# Patient Record
Sex: Female | Born: 1950
Health system: Southern US, Community
[De-identification: ages and names within clinical notes are randomized; demographics above are authoritative.]

## PROBLEM LIST (undated history)

## (undated) DIAGNOSIS — F32A Depression, unspecified: Secondary | ICD-10-CM

## (undated) DIAGNOSIS — T7840XA Allergy, unspecified, initial encounter: Secondary | ICD-10-CM

## (undated) DIAGNOSIS — Z8673 Personal history of transient ischemic attack (TIA), and cerebral infarction without residual deficits: Secondary | ICD-10-CM

## (undated) DIAGNOSIS — I10 Essential (primary) hypertension: Secondary | ICD-10-CM

## (undated) DIAGNOSIS — K219 Gastro-esophageal reflux disease without esophagitis: Secondary | ICD-10-CM

## (undated) DIAGNOSIS — F411 Generalized anxiety disorder: Secondary | ICD-10-CM

## (undated) DIAGNOSIS — E785 Hyperlipidemia, unspecified: Secondary | ICD-10-CM

## (undated) DIAGNOSIS — F329 Major depressive disorder, single episode, unspecified: Secondary | ICD-10-CM

## (undated) DIAGNOSIS — E119 Type 2 diabetes mellitus without complications: Secondary | ICD-10-CM

## (undated) HISTORY — DX: Personal history of transient ischemic attack (TIA), and cerebral infarction without residual deficits: Z86.73

## (undated) HISTORY — PX: CHOLECYSTECTOMY: SHX55

## (undated) HISTORY — DX: Type 2 diabetes mellitus without complications: E11.9

## (undated) HISTORY — DX: Generalized anxiety disorder: F41.1

## (undated) HISTORY — DX: Depression, unspecified: F32.A

## (undated) HISTORY — DX: Essential (primary) hypertension: I10

## (undated) HISTORY — DX: Hyperlipidemia, unspecified: E78.5

## (undated) HISTORY — DX: Allergy, unspecified, initial encounter: T78.40XA

## (undated) HISTORY — DX: Gastro-esophageal reflux disease without esophagitis: K21.9

## (undated) HISTORY — PX: TUBAL LIGATION: SHX77

---
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
## 2003-07-13 ENCOUNTER — Emergency Department (HOSPITAL_COMMUNITY): Admission: EM | Admit: 2003-07-13 | Discharge: 2003-07-13 | Payer: Self-pay | Admitting: Emergency Medicine

## 2014-07-04 ENCOUNTER — Ambulatory Visit (INDEPENDENT_AMBULATORY_CARE_PROVIDER_SITE_OTHER): Payer: Medicare Other

## 2014-07-06 ENCOUNTER — Ambulatory Visit (INDEPENDENT_AMBULATORY_CARE_PROVIDER_SITE_OTHER): Payer: Medicare Other

## 2014-07-06 ENCOUNTER — Ambulatory Visit (INDEPENDENT_AMBULATORY_CARE_PROVIDER_SITE_OTHER): Payer: Medicare Other | Admitting: Family Medicine

## 2014-07-06 ENCOUNTER — Telehealth: Payer: Self-pay

## 2014-07-06 VITALS — BP 168/110 | HR 90 | Temp 98.4°F | Resp 18 | Ht 66.0 in | Wt 230.0 lb

## 2014-07-06 DIAGNOSIS — R202 Paresthesia of skin: Secondary | ICD-10-CM | POA: Diagnosis not present

## 2014-07-06 DIAGNOSIS — I10 Essential (primary) hypertension: Secondary | ICD-10-CM

## 2014-07-06 DIAGNOSIS — R209 Unspecified disturbances of skin sensation: Secondary | ICD-10-CM

## 2014-07-06 LAB — COMPLETE METABOLIC PANEL WITH GFR
ALK PHOS: 67 U/L (ref 39–117)
ALT: 23 U/L (ref 0–35)
AST: 16 U/L (ref 0–37)
Albumin: 4.6 g/dL (ref 3.5–5.2)
BUN: 12 mg/dL (ref 6–23)
CALCIUM: 9.9 mg/dL (ref 8.4–10.5)
CHLORIDE: 103 meq/L (ref 96–112)
CO2: 24 mEq/L (ref 19–32)
CREATININE: 0.77 mg/dL (ref 0.50–1.10)
GFR, EST NON AFRICAN AMERICAN: 81 mL/min
GLUCOSE: 92 mg/dL (ref 70–99)
Potassium: 4.2 mEq/L (ref 3.5–5.3)
SODIUM: 138 meq/L (ref 135–145)
Total Bilirubin: 0.5 mg/dL (ref 0.2–1.2)
Total Protein: 8.1 g/dL (ref 6.0–8.3)

## 2014-07-06 LAB — POCT URINALYSIS DIPSTICK
Bilirubin, UA: NEGATIVE
GLUCOSE UA: NEGATIVE
KETONES UA: NEGATIVE
NITRITE UA: NEGATIVE
PROTEIN UA: NEGATIVE
SPEC GRAV UA: 1.015
Urobilinogen, UA: 0.2
pH, UA: 6

## 2014-07-06 LAB — POCT CBC
Granulocyte percent: 68.4 %G (ref 37–80)
HCT, POC: 44.4 % (ref 37.7–47.9)
Hemoglobin: 15.1 g/dL (ref 12.2–16.2)
LYMPH, POC: 2.1 (ref 0.6–3.4)
MCH: 31.2 pg (ref 27–31.2)
MCHC: 33.9 g/dL (ref 31.8–35.4)
MCV: 92 fL (ref 80–97)
MID (CBC): 0.5 (ref 0–0.9)
MPV: 7.1 fL (ref 0–99.8)
POC GRANULOCYTE: 5.7 (ref 2–6.9)
POC LYMPH %: 25.4 % (ref 10–50)
POC MID %: 6.2 % (ref 0–12)
Platelet Count, POC: 428 10*3/uL — AB (ref 142–424)
RBC: 4.83 M/uL (ref 4.04–5.48)
RDW, POC: 12.8 %
WBC: 8.3 10*3/uL (ref 4.6–10.2)

## 2014-07-06 LAB — POCT SEDIMENTATION RATE: POCT SED RATE: 29 mm/h — AB (ref 0–22)

## 2014-07-06 LAB — TSH: TSH: 1.991 u[IU]/mL (ref 0.350–4.500)

## 2014-07-06 MED ORDER — LISINOPRIL-HYDROCHLOROTHIAZIDE 10-12.5 MG PO TABS: 1.0000 | ORAL_TABLET | Freq: Every day | ORAL | Status: DC

## 2014-07-06 NOTE — Telephone Encounter (Signed)
Patient is calling to get more details about her referral. Please call! [phone]

## 2014-07-06 NOTE — Progress Notes (Signed)
07/06/2014 at 10:49 AM  Penny Bryant / DOB: [birth year] / MRN: [id number]  The patient  does not have a problem list on file.  SUBJECTIVE  Chief complaint: Follow-up and Diarrhea  Patient here for an episode of improving right sided facial and arm numbness that started last Thursday.  Patient describes "it's as if someone took a magic marker and drew straight down the center of my face and neck, and the numbness was to the right of that."  She reports she could feel pressure only, as if it was asleep.  She reports did have a sore throat the Sunday before all this happened.  She does not complain of weakness and a change in dexterity. She has high blood pressure and does not know when this started. She denies rash and itchiness.      She  has a past medical history of Allergy.    Medications reviewed and updated by myself where necessary, and exist elsewhere in the encounter.   Penny Bryant is allergic to sulfur. She  reports that she has been smoking.  She does not have any smokeless tobacco history on file. She reports that she drinks about 1.8 oz of alcohol per week. She reports that she does not use illicit drugs. She  has no sexual activity history on file. The patient  has past surgical history that includes Cholecystectomy (Bilateral).  Her family history includes Diabetes in her father and mother; Hypertension in her father, mother, and sister; Stroke in her mother.  Review of Systems  Constitutional: Negative for fever and chills.  Eyes: Negative.   Cardiovascular: Negative for chest pain.  Gastrointestinal: Negative for nausea.  Skin: Negative for itching and rash.  Neurological: Positive for tingling. Negative for dizziness, tremors, speech change, focal weakness and seizures.    OBJECTIVE  Her  height is 5\' 6"  (1.676 m) and weight is 230 lb (104.327 kg). Her oral temperature is 98.4 F (36.9 C). Her blood pressure is 168/110 and her pulse is 90. Her respiration is 18 and oxygen  saturation is 97%.  The patient's body mass index is 37.14 kg/(m^2).  Physical Exam  Constitutional: She is oriented to person, place, and time.  Cardiovascular: Normal rate.   Respiratory: Effort normal.  GI: Soft. Bowel sounds are normal.  Musculoskeletal: Normal range of motion.  Neurological: She is alert and oriented to person, place, and time. No cranial nerve deficit. Coordination normal.  Skin: Skin is warm and dry.  Psychiatric: She has a normal mood and affect. Her behavior is normal. Judgment and thought content normal.    Results for orders placed or performed in visit on 07/06/14 (from the past 24 hour(s))  POCT CBC     Status: Abnormal   Collection Time: 07/06/14 10:28 AM  Result Value Ref Range   WBC 8.3 4.6 - 10.2 K/uL   Lymph, poc 2.1 0.6 - 3.4   POC LYMPH PERCENT 25.4 10 - 50 %L   MID (cbc) 0.5 0 - 0.9   POC MID % 6.2 0 - 12 %M   POC Granulocyte 5.7 2 - 6.9   Granulocyte percent 68.4 37 - 80 %G   RBC 4.83 4.04 - 5.48 M/uL   Hemoglobin 15.1 12.2 - 16.2 g/dL   HCT, POC 66.4 40.3 - 47.9 %   MCV 92.0 80 - 97 fL   MCH, POC 31.2 27 - 31.2 pg   MCHC 33.9 31.8 - 35.4 g/dL   RDW, POC 47.4 %  Platelet Count, POC 428 (A) 142 - 424 K/uL   MPV 7.1 0 - 99.8 fL  POCT urinalysis dipstick     Status: None   Collection Time: 07/06/14 10:28 AM  Result Value Ref Range   Color, UA YELLOW    Clarity, UA CLEAR    Glucose, UA NEG\    Bilirubin, UA NEG    Ketones, UA NEG    Spec Grav, UA 1.015    Blood, UA TR    pH, UA 6.0    Protein, UA NEG    Urobilinogen, UA 0.2    Nitrite, UA NEG    Leukocytes, UA Trace    [facility] reading (PRIMARY) by  Dr. Alwyn Ren: Negative for osseous abnormality.    ASSESSMENT & PLAN  Penny Bryant was seen today for follow-up and diarrhea.  Diagnoses and all orders for this visit:  Paresthesia of arm Orders: -     DG Cervical Spine Complete; Future  Facial paresthesia Orders: -     POCT SEDIMENTATION RATE  Essential hypertension Orders: -      POCT CBC -     POCT urinalysis dipstick -     COMPLETE METABOLIC PANEL WITH GFR -     TSH    The patient was advised to call or come back to clinic if she does not see an improvement in symptoms, or worsens with the above plan.   Deliah Boston, MHS, PA-C [facility] 07/06/2014 10:49 AM

## 2014-07-07 LAB — VARICELLA ZOSTER ANTIBODY, IGG: VARICELLA IGG: 407.6 {index} — AB (ref <135.00)

## 2014-07-07 NOTE — Progress Notes (Signed)
Patient was discussed with Deliah Boston, PA-C. X-rays examined. Report made. Treatment plan agreed upon.  Peyton Najjar M.D. 07/06/14

## 2014-07-10 ENCOUNTER — Telehealth: Payer: Self-pay

## 2014-07-10 NOTE — Telephone Encounter (Signed)
Pt calling for labs. Please review. Thanks

## 2014-07-10 NOTE — Telephone Encounter (Signed)
Penny Bryant, Theses labs look okay except mild elevated ESR.  You saw her also and reviewed with me.  She is calling for reports.  I am at a conference, if you could please get back to her on these.  Thanks,  Ruben Reason MD

## 2014-07-11 NOTE — Telephone Encounter (Signed)
Spoke with patient.  We are proceeding with the MRI. Deliah Boston, MS, PA-C   4:57 PM, 07/11/2014

## 2014-07-24 ENCOUNTER — Other Ambulatory Visit: Payer: Self-pay | Admitting: Physician Assistant

## 2014-07-24 DIAGNOSIS — I10 Essential (primary) hypertension: Secondary | ICD-10-CM

## 2014-07-24 MED ORDER — LISINOPRIL-HYDROCHLOROTHIAZIDE 10-12.5 MG PO TABS: 1.0000 | ORAL_TABLET | Freq: Two times a day (BID) | ORAL | Status: DC

## 2014-07-24 NOTE — Telephone Encounter (Signed)
Blood pressure is 167/110  Please have PA / MD call patient.  Her MRI is not for a bit down the road.  [phone] Wondering if additional or change of BP meds are needed Prioritized due to BP reading.

## 2014-07-24 NOTE — Telephone Encounter (Signed)
Spoke with patient.  She is monitoring her BP daily and reports some improvement 140/90, 150/95, however she does continue to see some numbers in the 160/100s.  She also reports her facial numbness if improved.  She has an MRI schedules for 07/28/14.  Advise that she double her medication by taking one tab in the morning and one at night.  Will send a new prescription to the pharmacy.  Deliah BostonMichael Fredis Malkiewicz, MS, PA-C   8:15 PM, 07/24/2014

## 2014-07-24 NOTE — Progress Notes (Signed)
Increasing patient medication due to continued hypertension despite 2 weeks of therapy. Deliah BostonMichael Cage Gupton, MS, PA-C   8:18 PM, 07/24/2014

## 2014-07-25 NOTE — Progress Notes (Signed)
Left detailed message advising Rx was sent in.

## 2014-07-28 ENCOUNTER — Ambulatory Visit
Admission: RE | Admit: 2014-07-28 | Discharge: 2014-07-28 | Disposition: A | Discharge status: Home / Self Care | Payer: Medicare Other | Source: Ambulatory Visit | Attending: Physician Assistant | Admitting: Physician Assistant

## 2014-07-28 DIAGNOSIS — I639 Cerebral infarction, unspecified: Secondary | ICD-10-CM | POA: Diagnosis not present

## 2014-07-28 DIAGNOSIS — R202 Paresthesia of skin: Secondary | ICD-10-CM

## 2014-07-28 DIAGNOSIS — R209 Unspecified disturbances of skin sensation: Secondary | ICD-10-CM

## 2014-07-28 DIAGNOSIS — R200 Anesthesia of skin: Secondary | ICD-10-CM | POA: Diagnosis not present

## 2014-07-28 MED ORDER — GADOBENATE DIMEGLUMINE 529 MG/ML IV SOLN
20.0000 mL | Freq: Once | INTRAVENOUS | Status: AC | PRN
  Administered 2014-07-28: 20 mL via INTRAVENOUS

## 2014-07-30 ENCOUNTER — Ambulatory Visit (INDEPENDENT_AMBULATORY_CARE_PROVIDER_SITE_OTHER): Payer: Medicare Other | Admitting: Physician Assistant

## 2014-07-30 ENCOUNTER — Other Ambulatory Visit: Payer: Self-pay | Admitting: Physician Assistant

## 2014-07-30 VITALS — BP 130/70 | HR 100 | Temp 98.5°F | Resp 18 | Ht 66.0 in | Wt 228.2 lb

## 2014-07-30 DIAGNOSIS — I10 Essential (primary) hypertension: Secondary | ICD-10-CM

## 2014-07-30 DIAGNOSIS — Z720 Tobacco use: Secondary | ICD-10-CM

## 2014-07-30 DIAGNOSIS — R000 Tachycardia, unspecified: Secondary | ICD-10-CM | POA: Diagnosis not present

## 2014-07-30 DIAGNOSIS — Z131 Encounter for screening for diabetes mellitus: Secondary | ICD-10-CM | POA: Diagnosis not present

## 2014-07-30 DIAGNOSIS — E785 Hyperlipidemia, unspecified: Secondary | ICD-10-CM

## 2014-07-30 DIAGNOSIS — F17210 Nicotine dependence, cigarettes, uncomplicated: Secondary | ICD-10-CM

## 2014-07-30 DIAGNOSIS — F4323 Adjustment disorder with mixed anxiety and depressed mood: Secondary | ICD-10-CM | POA: Diagnosis not present

## 2014-07-30 DIAGNOSIS — I639 Cerebral infarction, unspecified: Secondary | ICD-10-CM

## 2014-07-30 LAB — POCT CBC
Granulocyte percent: 69.7 % (ref 37–80)
HCT, POC: 42.7 % (ref 37.7–47.9)
Hemoglobin: 14.2 g/dL (ref 12.2–16.2)
Lymph, poc: 2.2 (ref 0.6–3.4)
MCH, POC: 30.3 pg (ref 27–31.2)
MCHC: 33.2 g/dL (ref 31.8–35.4)
MCV: 91 fL (ref 80–97)
MID (cbc): 0.6 (ref 0–0.9)
MPV: 7.1 fL (ref 0–99.8)
POC Granulocyte: 6.3 (ref 2–6.9)
POC LYMPH PERCENT: 23.9 % (ref 10–50)
POC MID %: 6.4 % (ref 0–12)
Platelet Count, POC: 417 K/uL (ref 142–424)
RBC: 4.69 M/uL (ref 4.04–5.48)
RDW, POC: 11.7 %
WBC: 9 K/uL (ref 4.6–10.2)

## 2014-07-30 LAB — LIPID PANEL
Cholesterol: 240 mg/dL — ABNORMAL HIGH (ref 0–200)
HDL: 38 mg/dL — AB (ref >=46)
Total CHOL/HDL Ratio: 6.3 Ratio
Triglycerides: 403 mg/dL — ABNORMAL HIGH (ref <150)

## 2014-07-30 LAB — COMPLETE METABOLIC PANEL WITHOUT GFR
ALT: 26 U/L (ref 0–35)
AST: 20 U/L (ref 0–37)
Albumin: 4.5 g/dL (ref 3.5–5.2)
Alkaline Phosphatase: 62 U/L (ref 39–117)
BUN: 14 mg/dL (ref 6–23)
CO2: 27 meq/L (ref 19–32)
Calcium: 9.4 mg/dL (ref 8.4–10.5)
Chloride: 100 meq/L (ref 96–112)
Creat: 0.82 mg/dL (ref 0.50–1.10)
GFR, Est African American: 87 mL/min
GFR, Est Non African American: 75 mL/min
Glucose, Bld: 111 mg/dL — ABNORMAL HIGH (ref 70–99)
Potassium: 4 meq/L (ref 3.5–5.3)
Sodium: 139 meq/L (ref 135–145)
Total Bilirubin: 0.4 mg/dL (ref 0.2–1.2)
Total Protein: 7.7 g/dL (ref 6.0–8.3)

## 2014-07-30 LAB — POCT GLYCOSYLATED HEMOGLOBIN (HGB A1C): Hemoglobin A1C: 6

## 2014-07-30 MED ORDER — ROSUVASTATIN CALCIUM 10 MG PO TABS: 10.0000 mg | ORAL_TABLET | Freq: Every day | ORAL | Status: DC

## 2014-07-30 MED ORDER — METOPROLOL TARTRATE 25 MG PO TABS: 12.5000 mg | ORAL_TABLET | Freq: Two times a day (BID) | ORAL | Status: DC

## 2014-07-30 NOTE — Progress Notes (Unsigned)
Patient with ASCVD risk of 17%.  She continues to smoke.  Will reevaluate the need for this medication in the future. Deliah BostonMichael Lariza Cothron, MS, PA-C   4:46 PM, 07/30/2014

## 2014-07-30 NOTE — Progress Notes (Signed)
07/30/2014 at 10:49 AM  Penny Bryant / DOB: 08/26/1950 / MRN: [id number]  The patient has Essential hypertension on her problem list.  SUBJECTIVE  Chief complaint: Blood Pressure Check and Results   Patient here for BP check and MRI results.  She has a BP log with her today and it has been running around 130-150/90-100.  She denies any side effects associated with the medication. Her numbness continues, but she denies a change in neuromuscular function.   She reports being under quite a lot of stress lately due to her recent diagnosis and her crazy family.  She feels frightened with regard to herself and frustrated with her family.  She is amenable to see a psychotherapist.   She  has a past medical history of Allergy.    Medications reviewed and updated by myself where necessary, and exist elsewhere in the encounter.   Penny Bryant is allergic to sulfur. She  reports that she has been smoking.  She does not have any smokeless tobacco history on file. She reports that she drinks about 1.8 oz of alcohol per week. She reports that she does not use illicit drugs. She  has no sexual activity history on file. The patient  has past surgical history that includes Cholecystectomy (Bilateral).  Her family history includes Diabetes in her father and mother; Hypertension in her father, mother, and sister; Stroke in her mother.  Review of Systems  Constitutional: Negative for fever and chills.  Eyes: Negative for blurred vision.  Respiratory: Negative for cough.   Cardiovascular: Negative for chest pain.  Gastrointestinal: Negative for nausea.  Genitourinary: Negative.   Musculoskeletal: Negative for myalgias.  Skin: Negative for itching and rash.  Neurological: Negative for dizziness and headaches.  Endo/Heme/Allergies: Negative for polydipsia.  Psychiatric/Behavioral: Positive for depression. Negative for suicidal ideas, hallucinations, memory loss and substance abuse. The patient is nervous/anxious.  The patient does not have insomnia.     Depression screen [facility] 07/30/2014 07/04/2014  Decreased Interest 1 0  Down, Depressed, Hopeless 1 0  PHQ - 2 Score 2 0  Altered sleeping 3 -  Tired, decreased energy 2 -  Change in appetite 2 -  Feeling bad or failure about yourself  0 -  Trouble concentrating 1 -  Moving slowly or fidgety/restless 0 -  Suicidal thoughts 0 -  PHQ-9 Score 10 -  Difficult doing work/chores Somewhat difficult -      OBJECTIVE  Her  height is 5\' 6"  (1.676 m) and weight is 228 lb 4 oz (103.534 kg). Her oral temperature is 98.5 F (36.9 C). Her blood pressure is 130/70 and her pulse is 100. Her respiration is 18 and oxygen saturation is 97%.  The patient's body mass index is 36.86 kg/(m^2).  Physical Exam  Nursing note and vitals reviewed. Constitutional: She is oriented to person, place, and time. She appears well-developed and well-nourished. She does not appear ill.  HENT:  Mouth/Throat: Oropharynx is clear and moist.  Eyes: EOM are normal. Pupils are equal, round, and reactive to light.  Neck: No thyromegaly present.  Cardiovascular: Normal rate and regular rhythm.   Respiratory: Effort normal.  GI: She exhibits no distension.  Musculoskeletal: Normal range of motion.  Neurological: She is alert and oriented to person, place, and time. She has normal strength and normal reflexes. She displays no atrophy and no tremor. No cranial nerve deficit or sensory deficit. She exhibits normal muscle tone. She displays no seizure activity. Coordination and gait normal.  GCS eye subscore is 4. GCS verbal subscore is 5. GCS motor subscore is 6.  Skin: Skin is warm and dry.  Psychiatric: She has a normal mood and affect. Her behavior is normal. Judgment and thought content normal.    Results for orders placed or performed in visit on 07/30/14 (from the past 24 hour(s))  POCT CBC     Status: None   Collection Time: 07/30/14  9:25 AM  Result Value Ref Range   WBC 9.0  4.6 - 10.2 K/uL   Lymph, poc 2.2 0.6 - 3.4   POC LYMPH PERCENT 23.9 10 - 50 %L   MID (cbc) 0.6 0 - 0.9   POC MID % 6.4 0 - 12 %M   POC Granulocyte 6.3 2 - 6.9   Granulocyte percent 69.7 37 - 80 %G   RBC 4.69 4.04 - 5.48 M/uL   Hemoglobin 14.2 12.2 - 16.2 g/dL   HCT, POC 16.1 09.6 - 47.9 %   MCV 91.0 80 - 97 fL   MCH, POC 30.3 27 - 31.2 pg   MCHC 33.2 31.8 - 35.4 g/dL   RDW, POC 04.5 %   Platelet Count, POC 417 142 - 424 K/uL   MPV 7.1 0 - 99.8 fL  POCT glycosylated hemoglobin (Hb A1C)     Status: None   Collection Time: 07/30/14  9:25 AM  Result Value Ref Range   Hemoglobin A1C 6.0     ASSESSMENT & PLAN  Rella was seen today for blood pressure check and results.  Diagnoses and all orders for this visit:  Tachycardia with 100 - 120 beats per minute: Patients heart rate appears labile.  Will start at a low dose of metoprolol and titrate accordingly.  Orders: -     metoprolol tartrate (LOPRESSOR) 25 MG tablet; Take 0.5 tablets (12.5 mg total) by mouth 2 (two) times daily. -     EKG 12-Lead  Adjustment disorder with mixed anxiety and depressed mood: Patient with positive PHQ-9 screening into mild depression. This is most likely 2/2 a sudden change in her health.  Options discussed and patient opting for counseling at this time. Will revisit this in at future visits.  Orders: -     Ambulatory referral to Psychology  CVA (cerebral vascular accident): Patient with positive MRI for ischemia in the right basal ganglia and thalmus. Given the hemosiderin deposition seen on MRI I've asked that she hold off of ASA for now and await a recommendation from her neurologist.  Fortunately it appears that this incident was mild and her neurological exam remains intact, however her numbness persist. I will attempt to control for risk factors and have pleaded with her to quit smoking.  Her BP is reducing nicely and I will strive to achieve a BP lower than 130/90. I have spoken with our ancillary staff  in an attempt to have her seen by neurology next week.     Orders: -     Ambulatory referral to Neurology  Essential hypertension: She is responding to Prinzide 10-12.5 nicely.  I will add Metoprolol in the hopes of achieve an additional mild reduction and to manage problem one.  Orders: -     POCT CBC -     COMPLETE METABOLIC PANEL WITH GFR  Screening for diabetes mellitus: A1C elevated to that of a prediabetic.  Will discuss this with patient at her next follow up.   Orders: -     POCT glycosylated hemoglobin (Hb A1C)  Cigarette  smoker: Managed with problem three.  Patient has agreed to quit soon.  I will prescribe per her request.      The patient was advised to call or come back to clinic if she does not see an improvement in symptoms, or worsens with the above plan.   Deliah Boston, MHS, PA-C [facility] 07/30/2014 10:49 AM

## 2014-08-02 ENCOUNTER — Telehealth: Payer: Self-pay

## 2014-08-02 NOTE — Telephone Encounter (Signed)
Patient is calling because she has [facility] and they needs the MRI results sent.

## 2014-08-02 NOTE — Telephone Encounter (Signed)
Spoke with patient. Informed her that [facility] should be on Epic and already have access to MRI report. Called [facility] and spoke with Annabelle HarmanDana who confirmed that they are on Epic and no need to send report.

## 2014-08-04 ENCOUNTER — Telehealth: Payer: Self-pay

## 2014-08-04 ENCOUNTER — Telehealth: Payer: Self-pay | Admitting: Physician Assistant

## 2014-08-04 NOTE — Telephone Encounter (Signed)
Penny Bryant, pt called. She says that the Crestor is giving her myalgia in her neck and back. Wonders if the 10mg  is too strong or if she should try something else. I don't her to D/C it in the meantime. Please advise. Thanks

## 2014-08-04 NOTE — Telephone Encounter (Signed)
Please call her back and ask her to to break the tab in half and take every other day for now. Penny BostonMichael Clark, MS, PA-C   8:42 AM, 08/04/2014

## 2014-08-04 NOTE — Telephone Encounter (Signed)
Pt called about Crestor and she has decreased her dose to 5mg /day. She states she is still having and neck pain.  Please advise [phone]

## 2014-08-04 NOTE — Telephone Encounter (Signed)
Pt.notified

## 2014-08-05 NOTE — Telephone Encounter (Signed)
Please call patient and ask her to take 1/4 of the pill daily.  If this fails then we may have to try another medication.  Penny BostonMichael Tiffanny Lamarche, MS, PA-C   9:55 AM, 08/05/2014

## 2014-08-08 ENCOUNTER — Ambulatory Visit (INDEPENDENT_AMBULATORY_CARE_PROVIDER_SITE_OTHER): Payer: Medicare Other | Admitting: Neurology

## 2014-08-08 ENCOUNTER — Encounter: Payer: Self-pay | Admitting: Neurology

## 2014-08-08 VITALS — BP 132/78 | HR 76 | Resp 16 | Ht 67.0 in | Wt 227.0 lb

## 2014-08-08 DIAGNOSIS — E785 Hyperlipidemia, unspecified: Secondary | ICD-10-CM | POA: Diagnosis not present

## 2014-08-08 DIAGNOSIS — I6381 Other cerebral infarction due to occlusion or stenosis of small artery: Secondary | ICD-10-CM

## 2014-08-08 DIAGNOSIS — I6322 Cerebral infarction due to unspecified occlusion or stenosis of basilar arteries: Principal | ICD-10-CM

## 2014-08-08 DIAGNOSIS — I63212 Cerebral infarction due to unspecified occlusion or stenosis of left vertebral arteries: Secondary | ICD-10-CM

## 2014-08-08 DIAGNOSIS — I639 Cerebral infarction, unspecified: Secondary | ICD-10-CM | POA: Insufficient documentation

## 2014-08-08 DIAGNOSIS — I10 Essential (primary) hypertension: Secondary | ICD-10-CM

## 2014-08-08 DIAGNOSIS — F17200 Nicotine dependence, unspecified, uncomplicated: Secondary | ICD-10-CM

## 2014-08-08 DIAGNOSIS — Z720 Tobacco use: Secondary | ICD-10-CM

## 2014-08-08 DIAGNOSIS — I63219 Cerebral infarction due to unspecified occlusion or stenosis of unspecified vertebral arteries: Secondary | ICD-10-CM | POA: Insufficient documentation

## 2014-08-08 DIAGNOSIS — E669 Obesity, unspecified: Secondary | ICD-10-CM

## 2014-08-08 NOTE — Telephone Encounter (Signed)
Spoke with pt, advised message from Michael. Pt understood. 

## 2014-08-08 NOTE — Telephone Encounter (Signed)
Patient states that she went to her neurologist today and they advised her that she needs to stop taking the Crestor and start taking Prozac.   [phone]

## 2014-08-08 NOTE — Progress Notes (Signed)
NEUROLOGY CONSULTATION NOTE  SHELLEY COCKE MRN: [id number] DOB: 1950-06-10  Referring provider: Deliah Boston, PA-C Primary care provider: Deliah Boston, PA-C  Reason for consult:  stroke  Thank you for your kind referral of Nesha L Roedl for consultation of the above symptoms. Although her history is well known to you, please allow me to reiterate it for the purpose of our medical record. Records and images were personally reviewed where available.  HISTORY OF PRESENT ILLNESS: This is a pleasant 64 year old right-handed woman with vascular risk factors including hypertension, hyperlipidemia, tobacco use, presenting for evaluation of right-sided numbness that started last 07/06/2014. She reports being under a great deal of stress from family, she had been significantly stressed for 2 days when she woke up on 07/06/14 with the right side of her face and arm (up to wrist) feeling numb and tingly. The next day, she went to a graduation and noticed difficulties sitting on the chair, finding that the right buttock region was numb as well. Her right leg was unaffected. She denied any focal weakness. She felt a little confused that weekend. She saw her PCP and MRI was ordered, which was done last 07/28/14. I personally reviewed MRI brain with and without contrast which did not show any acute infarct. There is an old infarct in the right basal ganglia and right thalamus with mild hemosiderin deposition. 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 She reports that symptoms started improving last weekend, the numbness in her right buttock region has resolved, there is only a 3-width band of numbness from the right vertex region down her right ear, ending in the mid-upper arm. She had bloodwork done showing an HbA1c of 6, lipid panel showed elevated total cholesterol 240, triglycerides 403. LDL could not be calculated due to triglyceride >400. She was started on Crestor, which she has  been having difficulties with. She feels diffuse body weakness and fatigue, as well as cramps in her calves and back. She has neck stiffness and feels like her head is a "bowling ball sitting on top of a toothpick." She is concerned because she is usually active but states now she cannot get up and go anymore. She gets woozy when bending over. She denies any dysarthria, dysphagia, vision changes, back pain, bowel/bladder incontinence. She has infrequent headaches, but had one 4 days ago and took Ibuprofen. She used to drink around 3 glasses of wine/week, she has only had one drink in the past month. She had previously been smoking 3 packs a week, she has reduced this recently to 2 packs/week. Her mother has a history of stroke, she denies any prior similar symptoms.  Laboratory Data: Lab Results  Component Value Date   WBC 9.0 07/30/2014   HGB 14.2 07/30/2014   HCT 42.7 07/30/2014   MCV 91.0 07/30/2014     Chemistry      Component Value Date/Time   NA 139 07/30/2014 0913   K 4.0 07/30/2014 0913   CL 100 07/30/2014 0913   CO2 27 07/30/2014 0913   BUN 14 07/30/2014 0913   CREATININE 0.82 07/30/2014 0913      Component Value Date/Time   CALCIUM 9.4 07/30/2014 0913   ALKPHOS 62 07/30/2014 0913   AST 20 07/30/2014 0913   ALT 26 07/30/2014 0913   BILITOT 0.4 07/30/2014 0913     Lab Results  Component Value Date   CHOL 240* 07/30/2014   HDL 38* 07/30/2014   LDLCALC  NOT CALC 07/30/2014   TRIG 403* 07/30/2014   CHOLHDL 6.3 07/30/2014   Lab Results  Component Value Date   TSH 1.991 07/06/2014      PAST MEDICAL HISTORY: Past Medical History  Diagnosis Date  . Allergy     PAST SURGICAL HISTORY: Past Surgical History  Procedure Laterality Date  . Cholecystectomy Bilateral     MEDICATIONS: Current Outpatient Prescriptions on File Prior to Visit  Medication Sig Dispense Refill  . metoprolol tartrate (LOPRESSOR) 25 MG tablet Take 0.5 tablets (12.5 mg total) by mouth 2 (two)  times daily. 30 tablet 3  . rosuvastatin (CRESTOR) 10 MG tablet Take 1 tablet (10 mg total) by mouth daily. (Patient taking differently: Take by mouth daily. Takes 1/4 tablet daily) 30 tablet 3   No current facility-administered medications on file prior to visit.    ALLERGIES: Allergies  Allergen Reactions  . Sulfur Swelling    FAMILY HISTORY: Family History  Problem Relation Age of Onset  . Diabetes Mother   . Hypertension Mother   . Stroke Mother   . Diabetes Father   . Hypertension Father   . Hypertension Sister     SOCIAL HISTORY: History   Social History  . Marital Status: Divorced    Spouse Name: N/A  . Number of Children: 3  . Years of Education: N/A   Occupational History  . Pharmacy Technician    Social History Main Topics  . Smoking status: Current Every Day Smoker -- 1.50 packs/day  . Smokeless tobacco: Never Used  . Alcohol Use: 1.8 oz/week    3 Glasses of wine per week  . Drug Use: No  . Sexual Activity: Not on file   Other Topics Concern  . Not on file   Social History Narrative    REVIEW OF SYSTEMS: Constitutional: No fevers, chills, or sweats, no generalized fatigue, change in appetite Eyes: No visual changes, double vision, eye pain Ear, nose and throat: No hearing loss, ear pain, nasal congestion, sore throat Cardiovascular: No chest pain, palpitations Respiratory:  No shortness of breath at rest or with exertion, wheezes GastrointestinaI: No nausea, vomiting, diarrhea, abdominal pain, fecal incontinence Genitourinary:  No dysuria, urinary retention or frequency Musculoskeletal:  + neck pain, no back pain Integumentary: No rash, pruritus, skin lesions Neurological: as above Psychiatric: No depression, insomnia, anxiety Endocrine: No palpitations, fatigue, diaphoresis, mood swings, change in appetite, change in weight, increased thirst Hematologic/Lymphatic:  No anemia, purpura, petechiae. Allergic/Immunologic: no itchy/runny eyes,  nasal congestion, recent allergic reactions, rashes  PHYSICAL EXAM: Filed Vitals:   08/08/14 1245  BP: 132/78  Pulse: 76  Resp: 16   Filed Vitals:   08/08/14 1245  Height: 5\' 7"  (1.702 m)  Weight: 227 lb (102.967 kg)    General: No acute distress Head:  Normocephalic/atraumatic Eyes: Fundoscopic exam shows bilateral sharp discs, no vessel changes, exudates, or hemorrhages Neck: supple, no paraspinal tenderness, full range of motion Back: No paraspinal tenderness Heart: regular rate and rhythm Lungs: Clear to auscultation bilaterally. Vascular: No carotid bruits. Skin/Extremities: No rash, no edema Neurological Exam: Mental status: alert and oriented to person, place, and time, no dysarthria or aphasia, Fund of knowledge is appropriate.  Recent and remote memory are intact.  Attention and concentration are normal.    Able to name objects and repeat phrases. Cranial nerves: CN I: not tested CN II: pupils equal, round and reactive to light, visual fields intact, fundi unremarkable. CN III, IV, VI:  full range of motion, no  nystagmus, no ptosis CN V: facial sensation intact CN VII: upper and lower face symmetric CN VIII: hearing intact to finger rub CN IX, X: gag intact, uvula midline CN XI: sternocleidomastoid and trapezius muscles intact CN XII: tongue midline Bulk & Tone: normal, no fasciculations. Motor: 5/5 throughout with no pronator drift. Sensation: intact to light touch, cold, pin, vibration and joint position sense.  No extinction to double simultaneous stimulation.  Romberg test negative Deep Tendon Reflexes: +1 throughout, no ankle clonus Plantar responses: downgoing bilaterally Cerebellar: no incoordination on finger to nose, heel to shin. No dysdiadochokinesia Gait: narrow-based and steady, able to tandem walk adequately. Tremor: none  IMPRESSION: This is a pleasant 64 year old right-handed woman with vascular risk factors including obesity, hypertension,  hyperlipidemia, tobacco use, presenting with a 41-month history of right face and arm numbness that started when she woke up with symptoms last 07/06/14. Her MRI brain done a month after does not show any acute changes, there is a chronic infarct in the right basal ganglia and thalamus, which would not explain her right-sided symptoms. She likely had an MRI-negative left thalamic infarct causing sensory symptoms. This is typically caused by small vessel disease, however stroke workup with carotid dopplers and 2-D echocardiogram with bubble study will be ordered to complete the workup. BP today 132/78. Her lipid panel is abnormal, we discussed the importance of control of vascular risk factors, she will continue to work with her PCP on hyperlipidemia. We discussed the importance of smoking cessation. She will start a daily baby aspirin for secondary stroke prevention. She knows to go to the ER immediately if symptoms recur. We discussed neck stiffness and option for physical therapy, which she declines at this time. She will follow-up in 3 months.   Thank you for allowing me to participate in the care of this patient. Please do not hesitate to call for any questions or concerns.   Patrcia Dolly, M.D.  CC: Deliah Boston, PA-C

## 2014-08-08 NOTE — Patient Instructions (Addendum)
1. Schedule carotid dopplers and 2D echocardiogram with bubble study 2. Start daily aspirin  3. Continue working on cholesterol with your PCP 4. Smoking cessation is important 5. If symptoms recur, go to ER immediately 6. Follow-up in 3 months

## 2014-08-08 NOTE — Telephone Encounter (Signed)
Pt would like to see if Casimiro NeedleMichael can Rx Prozac because she is still stressing and it helps her sister.

## 2014-08-09 ENCOUNTER — Other Ambulatory Visit: Payer: Self-pay | Admitting: Physician Assistant

## 2014-08-09 DIAGNOSIS — F4323 Adjustment disorder with mixed anxiety and depressed mood: Secondary | ICD-10-CM

## 2014-08-09 MED ORDER — ATORVASTATIN CALCIUM 10 MG PO TABS: 5.0000 mg | ORAL_TABLET | Freq: Every day | ORAL | Status: DC

## 2014-08-09 MED ORDER — FLUOXETINE HCL 20 MG PO CAPS: 20.0000 mg | ORAL_CAPSULE | Freq: Every day | ORAL | Status: DC

## 2014-08-09 NOTE — Telephone Encounter (Signed)
Please call patient and advise that prozac 20 mg was sent to the pharmacy. I have read the neurology notes and agree with the plan. I have a follow up with her soon and will go ahead and send a different lipid medication and will start at the lowest dose,  to the pharmacy that she may pick up with her prozac. Deliah BostonMichael Clark, MS, PA-C   3:22 PM, 08/09/2014

## 2014-08-10 ENCOUNTER — Ambulatory Visit: Payer: Medicare Other

## 2014-08-10 NOTE — Telephone Encounter (Signed)
Left detailed message letting pt know. 

## 2014-08-24 ENCOUNTER — Telehealth: Payer: Self-pay | Admitting: Neurology

## 2014-08-24 NOTE — Telephone Encounter (Signed)
Pt called about a referral for an Ultra sound/doppler/ call back @

## 2014-08-24 NOTE — Telephone Encounter (Signed)
I called [facility] [address] after getting patient's phone message. I called the office to check on the status of patient being called as the order was sent to them on 7/13 for a Bubble Study. I asked that they call the patient to schedule appt as she has been waiting to get this test done because I didn't know her availability.   I called patient and apologized to her for not having received a call yet. I did tell her that someone should be calling her to set up her test. I also gave her their phone number to call them in the morning if she didn't hear anything today. Patient stated she would do that.

## 2014-08-24 NOTE — Telephone Encounter (Signed)
Pt called and said she has not heard anything about an appointment for an ultrasound/Dawn CB# [phone]

## 2014-08-28 ENCOUNTER — Ambulatory Visit (INDEPENDENT_AMBULATORY_CARE_PROVIDER_SITE_OTHER): Payer: Medicare Other | Admitting: Physician Assistant

## 2014-08-28 ENCOUNTER — Telehealth: Payer: Self-pay | Admitting: *Deleted

## 2014-08-28 ENCOUNTER — Encounter: Payer: Self-pay | Admitting: Physician Assistant

## 2014-08-28 VITALS — BP 134/78 | HR 83 | Temp 99.0°F | Resp 16 | Wt 224.8 lb

## 2014-08-28 DIAGNOSIS — F418 Other specified anxiety disorders: Secondary | ICD-10-CM

## 2014-08-28 DIAGNOSIS — Z23 Encounter for immunization: Secondary | ICD-10-CM

## 2014-08-28 DIAGNOSIS — E785 Hyperlipidemia, unspecified: Secondary | ICD-10-CM | POA: Diagnosis not present

## 2014-08-28 DIAGNOSIS — Z889 Allergy status to unspecified drugs, medicaments and biological substances status: Secondary | ICD-10-CM | POA: Diagnosis not present

## 2014-08-28 DIAGNOSIS — F17200 Nicotine dependence, unspecified, uncomplicated: Secondary | ICD-10-CM

## 2014-08-28 DIAGNOSIS — Z720 Tobacco use: Secondary | ICD-10-CM | POA: Diagnosis not present

## 2014-08-28 DIAGNOSIS — I10 Essential (primary) hypertension: Secondary | ICD-10-CM

## 2014-08-28 DIAGNOSIS — I6322 Cerebral infarction due to unspecified occlusion or stenosis of basilar arteries: Principal | ICD-10-CM

## 2014-08-28 DIAGNOSIS — F419 Anxiety disorder, unspecified: Secondary | ICD-10-CM

## 2014-08-28 DIAGNOSIS — I63212 Cerebral infarction due to unspecified occlusion or stenosis of left vertebral arteries: Secondary | ICD-10-CM

## 2014-08-28 DIAGNOSIS — I6381 Other cerebral infarction due to occlusion or stenosis of small artery: Secondary | ICD-10-CM

## 2014-08-28 DIAGNOSIS — F32A Depression, unspecified: Secondary | ICD-10-CM

## 2014-08-28 DIAGNOSIS — F329 Major depressive disorder, single episode, unspecified: Secondary | ICD-10-CM | POA: Insufficient documentation

## 2014-08-28 DIAGNOSIS — Z789 Other specified health status: Secondary | ICD-10-CM

## 2014-08-28 LAB — COMPLETE METABOLIC PANEL WITH GFR
ALT: 22 U/L (ref 6–29)
AST: 17 U/L (ref 10–35)
Albumin: 4.3 g/dL (ref 3.6–5.1)
Alkaline Phosphatase: 64 U/L (ref 33–130)
BILIRUBIN TOTAL: 0.5 mg/dL (ref 0.2–1.2)
BUN: 18 mg/dL (ref 7–25)
CO2: 27 mmol/L (ref 20–31)
Calcium: 9.9 mg/dL (ref 8.6–10.4)
Chloride: 97 mmol/L — ABNORMAL LOW (ref 98–110)
Creat: 0.99 mg/dL (ref 0.50–0.99)
GFR, EST AFRICAN AMERICAN: 69 mL/min (ref >=60)
GFR, Est Non African American: 60 mL/min (ref >=60)
Glucose, Bld: 106 mg/dL — ABNORMAL HIGH (ref 65–99)
POTASSIUM: 4.1 mmol/L (ref 3.5–5.3)
Sodium: 137 mmol/L (ref 135–146)
TOTAL PROTEIN: 7.9 g/dL (ref 6.1–8.1)

## 2014-08-28 MED ORDER — ATORVASTATIN CALCIUM 10 MG PO TABS: ORAL_TABLET | ORAL | Status: DC

## 2014-08-28 MED ORDER — ZOSTER VACCINE LIVE 19400 UNT/0.65ML ~~LOC~~ SOLR: 0.6500 mL | Freq: Once | SUBCUTANEOUS | Status: DC

## 2014-08-28 NOTE — Progress Notes (Signed)
08/28/2014 at 2:12 PM  Penny Bryant / DOB: 1950-06-26 / MRN: [id number]  The patient has Essential hypertension; Stroke; Arterial ischemic stroke, vertebrobasilar, thalamic, acute; Hyperlipidemia; Tobacco use disorder; and Obesity on her problem list.  SUBJECTIVE  Chief complaint: Hypertension; Neurology appointment; and side effects from medication  Patient here for follow up of medication and neuro visit.  She would also like her pneumonia vaccination and to receive a "shingles shot."    Reports the neurologist informed her that she has had two strokes, one roughly a year ago and "a smaller one in the last two months."  She has been placed on ASA daily and was advised to seek medication for depression, at which time she contacted the office and Prozac was prescribed.  She is tolerating this without difficulty and reports "I'm not focusing on negative things as much and my mood is improved." She denies depression and anhedonia today. She did follow up with psychology but informed me that the practice does not accept her insurance.  She has chosen to seek counseling from her pastor at this time, and reports this is helping her sort through some of her emotions.  She will call if she desires another referral to a therapist.    She reports some side effects with rosuvastatin, stating that her neck feels "as stiff as a board" and reports this side effect is causing her difficulty with exercise. She has also tried Lipitor 5 mg qd and reports some improvement in this pain, but it is still present.   She is down to three cigarettes a day, one in the morning, noon and night.  She plans to cut these out completely in the coming months.     She has never had a colonoscopy, and is will to do this, however she would like to wait a few months until some of her other health issues "smooth out."   She  has a past medical history of Allergy.    Current Outpatient Prescriptions on File Prior to Visit    Medication Sig Dispense Refill  . cetirizine (ZYRTEC) 10 MG tablet Take 10 mg by mouth daily.    Marland Kitchen FLUoxetine (PROZAC) 20 MG capsule Take 1 capsule (20 mg total) by mouth daily. 30 capsule 3  . lisinopril-hydrochlorothiazide (PRINZIDE,ZESTORETIC) 20-25 MG per tablet Take 0.5 tablets by mouth 2 (two) times daily. Take 1/2 tablet twice daily  1  . metoprolol tartrate (LOPRESSOR) 25 MG tablet Take 0.5 tablets (12.5 mg total) by mouth 2 (two) times daily. 30 tablet 3   No current facility-administered medications on file prior to visit.     Ms. Bryant is allergic to sulfur. She  reports that she has been smoking.  She has never used smokeless tobacco. She reports that she drinks about 1.8 oz of alcohol per week. She reports that she does not use illicit drugs. She  has no sexual activity history on file. The patient  has past surgical history that includes Cholecystectomy (Bilateral).  Her family history includes Diabetes in her father and mother; Hypertension in her father, mother, and sister; Stroke in her mother.  Review of Systems  Constitutional: Positive for malaise/fatigue. Negative for chills and weight loss.  Respiratory: Negative for cough.   Cardiovascular: Negative for chest pain.  Gastrointestinal: Negative for nausea.  Skin: Negative for itching and rash.  Neurological: Negative for dizziness, focal weakness and headaches.    OBJECTIVE  Her  weight is 224 lb 12.8 oz (101.969 kg).  Her oral temperature is 99 F (37.2 C). Her blood pressure is 134/78 and her pulse is 83. Her respiration is 16.  The patient's body mass index is 35.2 kg/(m^2).  Physical Exam  Nursing note and vitals reviewed. Constitutional: She is oriented to person, place, and time. She appears well-developed and well-nourished. She does not appear ill.  HENT:  Mouth/Throat: Oropharynx is clear and moist.  Eyes: EOM are normal. Pupils are equal, round, and reactive to light.  Neck: No thyromegaly present.   Cardiovascular: Normal rate and regular rhythm.   Respiratory: Effort normal.  GI: She exhibits no distension.  Musculoskeletal: Normal range of motion.  Neurological: She is alert and oriented to person, place, and time. She has normal strength and normal reflexes. She displays no atrophy and no tremor. No cranial nerve deficit or sensory deficit. She exhibits normal muscle tone. She displays no seizure activity. Coordination and gait normal. GCS eye subscore is 4. GCS verbal subscore is 5. GCS motor subscore is 6.  Skin: Skin is warm and dry.  Psychiatric: She has a normal mood and affect. Her behavior is normal. Judgment and thought content normal.    No results found for this or any previous visit (from the past 24 hour(s)).  ASSESSMENT & PLAN  Penny was seen today for hypertension, neurology appointment and side effects from medication.  Diagnoses and all orders for this visit:  Essential hypertension: Patient with log here and will continue to track.  Her BP is on average, less 135/80.  Will maintain current treatment regimen and encouraged patient to continue with weight loss, as she has lost 8 lbs since her first visit to [facility].  Orders: -     COMPLETE METABOLIC PANEL WITH GFR  Hyperlipidemia: Advised her to take 1/4 Lipitor (2.5) as I do not want this medication to interfere with her attempts to increase her physical activity.  She will go back to 5 mg qd if her neck pain has not resolved in 7 days, as her statin is unlikely to be the culprit.  Orders: -     Discontinue: atorvastatin (LIPITOR) 10 MG tablet; Take 1/4th of this medication daily to see if your neck pain resolves. -     atorvastatin (LIPITOR) 10 MG tablet; Take 1/4th of this medication daily to see if your neck pain resolves.  Tobacco use disorder: Patient has drastically cut back in her smoking, however I have strongly urged to her to stop entirely and advised that she only need to call the office for any aid that she  may require.   Statin intolerance: Managed with problem 2.    Need for zoster vaccination Orders: -     Pneumococcal conjugate vaccine 13-valent IM  Need for prophylactic vaccination against Streptococcus pneumoniae (pneumococcus) Orders: -     zoster vaccine live, PF, (ZOSTAVAX) 16109 UNT/0.65ML injection; Inject 19,400 Units into the skin once.  Anxiety and depression: Patient with symptomatic improvement. Will titrate medication as needed in the coming months.      The patient was advised to call or come back to clinic if she does not see an improvement in symptoms, or worsenPlease call patient and ask her to take 1/4 of the pill daily.  If this fails then we may have to try another medication.  Deliah Boston, MS, PA-C   2:12 PM, 08/28/2014  s with the above plan.   Deliah Boston, MHS, PA-C [facility] 08/28/2014 2:12 PM

## 2014-08-28 NOTE — Telephone Encounter (Signed)
Patient checking on a referral she was not sure what type of test she was scheduled  to  Have.   Call  Back number [phone]

## 2014-08-28 NOTE — Telephone Encounter (Signed)
Pt called and said that [facility] needs to be called because the orders for her testing the numbers were wrong so they can't schedule her appointment/Dawn # for [facility] is [phone] and the persons name is Aggie Cosier

## 2014-08-29 NOTE — Telephone Encounter (Signed)
I spoke with Aggie Cosier at [facility]. She states that the order for the carotid doppler needed to be re-entered, it was put in under the wrong order number. She gave me the new order number for the test and I re-ordered the study. She is going to call the patient to schedule that test. Patient has already been scheduled for the bubble study.

## 2014-09-04 ENCOUNTER — Ambulatory Visit (HOSPITAL_COMMUNITY): Payer: Medicare Other | Attending: Cardiology

## 2014-09-04 DIAGNOSIS — I63212 Cerebral infarction due to unspecified occlusion or stenosis of left vertebral arteries: Secondary | ICD-10-CM

## 2014-09-04 DIAGNOSIS — E785 Hyperlipidemia, unspecified: Secondary | ICD-10-CM | POA: Insufficient documentation

## 2014-09-04 DIAGNOSIS — I071 Rheumatic tricuspid insufficiency: Secondary | ICD-10-CM | POA: Insufficient documentation

## 2014-09-04 DIAGNOSIS — F17200 Nicotine dependence, unspecified, uncomplicated: Secondary | ICD-10-CM | POA: Diagnosis not present

## 2014-09-04 DIAGNOSIS — I10 Essential (primary) hypertension: Secondary | ICD-10-CM | POA: Diagnosis not present

## 2014-09-04 DIAGNOSIS — I371 Nonrheumatic pulmonary valve insufficiency: Secondary | ICD-10-CM | POA: Insufficient documentation

## 2014-09-08 ENCOUNTER — Ambulatory Visit (HOSPITAL_COMMUNITY)
Admission: RE | Admit: 2014-09-08 | Discharge: 2014-09-08 | Disposition: A | Discharge status: Home / Self Care | Payer: Medicare Other | Source: Ambulatory Visit | Attending: Cardiovascular Disease | Admitting: Cardiovascular Disease

## 2014-09-08 DIAGNOSIS — I63212 Cerebral infarction due to unspecified occlusion or stenosis of left vertebral arteries: Secondary | ICD-10-CM

## 2014-09-08 DIAGNOSIS — I6523 Occlusion and stenosis of bilateral carotid arteries: Secondary | ICD-10-CM | POA: Diagnosis not present

## 2014-09-12 ENCOUNTER — Telehealth: Payer: Self-pay | Admitting: Family Medicine

## 2014-09-12 NOTE — Telephone Encounter (Signed)
Patient returned my call. I notified her of results & advisement.

## 2014-09-12 NOTE — Telephone Encounter (Signed)
Lmovm to return my call. 

## 2014-09-12 NOTE — Telephone Encounter (Signed)
-----   Message from Van Clines, MD sent at 09/12/2014  8:42 AM EDT ----- Pls let her know the echocardiogram looks good, it shows mild changes seen with hypertension, continue control of BP. Her carotid ultrasound showed mild plaque but no significant obstruction, continue with control of cholesterol to prevent further plaque buildup. Thanks

## 2014-09-15 ENCOUNTER — Telehealth: Payer: Self-pay | Admitting: Physician Assistant

## 2014-09-15 NOTE — Telephone Encounter (Signed)
I will complete for her.  Deliah Boston, MS, PA-C   3:14 PM, 09/15/2014

## 2014-09-15 NOTE — Telephone Encounter (Signed)
Penny Bryant please advise if you can fill these papers out for her or does she need to come back in to be seen?  She last seen you on 08/28/2014.  Please advise. thanks

## 2014-09-15 NOTE — Telephone Encounter (Signed)
Spoke to patient she states that she has additional FMLA paperwork that needs to be filled out.  She is continuing to have "dizzy spells" and is only able to work about an hour at a time.  She needs the paperwork to be dated beginning on 09/14/2014.  She will drop it off for Deliah Boston to complete. Advised patient that there is a fee for completing paperwork.  If anything further is needed from her please call her [phone].  She states that she no longer needs Casimiro Needle to call her.

## 2014-09-15 NOTE — Telephone Encounter (Signed)
PT needs a call back regarding Disability paperwork. She is having difficulty working her regularly scheduled hours and is afraid she will loose her job. Please contact her at  [phone] today, if possible, to discuss if we can provide a letter of fill out disability forms provided by her doctor.    [phone]

## 2014-09-15 NOTE — Telephone Encounter (Signed)
Called and left message on machine to make the patient aware that Deliah Boston will complete these forms for her and that we will call her once these are completed.  Will forward back to Deliah Boston to make him aware.

## 2014-09-19 ENCOUNTER — Other Ambulatory Visit: Payer: Self-pay | Admitting: Physician Assistant

## 2014-09-19 NOTE — Telephone Encounter (Signed)
Patient is needing a refill on Lisinopril looks like neurology prescribed it to her.  Do you want to refill this looks like she usually sees you?

## 2014-09-29 NOTE — Telephone Encounter (Signed)
Received FMLA forms from patient. Will put in [employer] for completion. Please return to [facility] tray at checkout when done.

## 2014-10-02 NOTE — Telephone Encounter (Signed)
This is done and in Jasmine's box. Deliah Boston, MS, PA-C   2:36 PM, 10/02/2014

## 2014-10-04 ENCOUNTER — Ambulatory Visit (INDEPENDENT_AMBULATORY_CARE_PROVIDER_SITE_OTHER): Payer: Medicare Other | Admitting: Physician Assistant

## 2014-10-04 ENCOUNTER — Encounter: Payer: Self-pay | Admitting: Physician Assistant

## 2014-10-04 VITALS — BP 114/76 | HR 71 | Temp 99.1°F | Resp 16 | Ht 67.0 in | Wt 224.6 lb

## 2014-10-04 DIAGNOSIS — R000 Tachycardia, unspecified: Secondary | ICD-10-CM | POA: Diagnosis not present

## 2014-10-04 DIAGNOSIS — I10 Essential (primary) hypertension: Secondary | ICD-10-CM | POA: Diagnosis not present

## 2014-10-04 DIAGNOSIS — Z8673 Personal history of transient ischemic attack (TIA), and cerebral infarction without residual deficits: Secondary | ICD-10-CM

## 2014-10-04 DIAGNOSIS — F4323 Adjustment disorder with mixed anxiety and depressed mood: Secondary | ICD-10-CM | POA: Diagnosis not present

## 2014-10-04 MED ORDER — LOSARTAN POTASSIUM-HCTZ 50-12.5 MG PO TABS: 1.0000 | ORAL_TABLET | Freq: Every day | ORAL | Status: DC

## 2014-10-04 MED ORDER — METOPROLOL TARTRATE 25 MG PO TABS: 12.5000 mg | ORAL_TABLET | Freq: Two times a day (BID) | ORAL | Status: DC

## 2014-10-04 MED ORDER — FLUOXETINE HCL 40 MG PO CAPS: 40.0000 mg | ORAL_CAPSULE | Freq: Every day | ORAL | Status: DC

## 2014-10-04 MED ORDER — LOSARTAN POTASSIUM-HCTZ 50-12.5 MG PO TABS: 1.0000 | ORAL_TABLET | Freq: Two times a day (BID) | ORAL | Status: DC

## 2014-10-04 NOTE — Progress Notes (Signed)
10/04/2014 at 7:34 PM  Alois R Coghlan / DOB: 1950/07/13 / MRN: [id number]  The patient has Essential hypertension; Stroke; Arterial ischemic stroke, vertebrobasilar, thalamic, acute; Hyperlipidemia; Tobacco use disorder; Obesity; and Anxiety and depression on her problem list.  SUBJECTIVE  Penny Bryant is a 64 y.o. well appearing female presenting for the chief complaint of of lip swelling that started 10 days ago and resolved within 5 days.  She is taking Lisinopril.  Denies any SOB, presyncope, throat/lip/tongue swelling.  Was not going to mention this initially, but just thought she should "bring it up."   She has her HTN log with her today, and her numbers are averaging 120-130/80-90.    She reports she is smoking roughly 3 cigarrettes daily and is ammenable to stopping today.  She plans to by some nicorrette gum and use this in case she has a craving.  She continues to decline colon screening.   September 29th she is to follow up with Neuro.  She would like a refill of her medications today.     She  has a past medical history of Allergy.    Medications reviewed and updated by myself where necessary, and exist elsewhere in the encounter.   Ms. Wimes is allergic to sulfur. She  reports that she has been smoking.  She has never used smokeless tobacco. She reports that she drinks about 1.8 oz of alcohol per week. She reports that she does not use illicit drugs. She  has no sexual activity history on file. The patient  has past surgical history that includes Cholecystectomy (Bilateral).  Her family history includes Diabetes in her father and mother; Hypertension in her father, mother, and sister; Stroke in her mother.  Review of Systems  Constitutional: Negative for fever and chills.  Respiratory: Negative for shortness of breath.   Cardiovascular: Negative for chest pain.  Gastrointestinal: Negative for nausea and abdominal pain.  Genitourinary: Negative.   Skin: Negative for rash.    Neurological: Negative for dizziness and headaches.    OBJECTIVE  Her  height is 5\' 7"  (1.702 m) and weight is 224 lb 9.6 oz (101.878 kg). Her oral temperature is 99.1 F (37.3 C). Her blood pressure is 114/76 and her pulse is 71. Her respiration is 16.  The patient's body mass index is 35.17 kg/(m^2).  Physical Exam  Constitutional: She is oriented to person, place, and time. She appears well-developed and well-nourished. No distress.  Eyes: Pupils are equal, round, and reactive to light.  Cardiovascular: Normal rate.   Respiratory: Effort normal. No respiratory distress.  GI: She exhibits no distension.  Neurological: She is alert and oriented to person, place, and time.  Skin: Skin is warm and dry. She is not diaphoretic.  Psychiatric: She has a normal mood and affect. Her behavior is normal. Judgment and thought content normal.    No results found for this or any previous visit (from the past 24 hour(s)).  ASSESSMENT & PLAN  Mickie was seen today for hypertension, hyperlipidemia and dizziness.  Diagnoses and all orders for this visit:  Adjustment disorder with mixed anxiety and depressed mood -     FLUoxetine (PROZAC) 40 MG capsule; Take 1 capsule (40 mg total) by mouth daily.  Status post CVA -     Ambulatory referral to Occupational Therapy  Tachycardia with 100 - 120 beats per minute -     metoprolol tartrate (LOPRESSOR) 25 MG tablet; Take 0.5 tablets (12.5 mg total) by mouth 2 (two)  times daily.  Essential hypertension: Stopping Lisinopril due to lip swelling.  Will start losartan and have advised that she keep a close eye on her BP over the next few days and call me with any abnormalities.   -     losartan-hydrochlorothiazide (HYZAAR) 50-12.5 MG per tablet; Take 1 tablet by mouth 2 (two) times daily.      The patient was advised to call or come back to clinic if she does not see an improvement in symptoms, or worsens with the above plan.   Deliah Boston, MHS,  PA-C [facility] 10/04/2014 7:34 PM

## 2014-10-04 NOTE — Telephone Encounter (Signed)
Penny Bryant,   Patient has conflicting instructions on her losartan is she to take it once a day or two times a day.   Please call [phone]

## 2014-10-16 ENCOUNTER — Telehealth: Payer: Self-pay

## 2014-10-20 DIAGNOSIS — Z0271 Encounter for disability determination: Secondary | ICD-10-CM

## 2014-10-26 ENCOUNTER — Telehealth: Payer: Self-pay

## 2014-10-26 ENCOUNTER — Encounter: Payer: Self-pay | Admitting: Neurology

## 2014-10-26 ENCOUNTER — Ambulatory Visit (INDEPENDENT_AMBULATORY_CARE_PROVIDER_SITE_OTHER): Payer: Medicare Other | Admitting: Neurology

## 2014-10-26 VITALS — BP 118/78 | HR 67 | Resp 16 | Ht 67.0 in | Wt 227.0 lb

## 2014-10-26 DIAGNOSIS — E785 Hyperlipidemia, unspecified: Secondary | ICD-10-CM | POA: Diagnosis not present

## 2014-10-26 DIAGNOSIS — I63212 Cerebral infarction due to unspecified occlusion or stenosis of left vertebral arteries: Secondary | ICD-10-CM

## 2014-10-26 DIAGNOSIS — I6381 Other cerebral infarction due to occlusion or stenosis of small artery: Secondary | ICD-10-CM

## 2014-10-26 DIAGNOSIS — M542 Cervicalgia: Secondary | ICD-10-CM

## 2014-10-26 DIAGNOSIS — R29898 Other symptoms and signs involving the musculoskeletal system: Secondary | ICD-10-CM

## 2014-10-26 DIAGNOSIS — I10 Essential (primary) hypertension: Secondary | ICD-10-CM | POA: Diagnosis not present

## 2014-10-26 DIAGNOSIS — I6322 Cerebral infarction due to unspecified occlusion or stenosis of basilar arteries: Principal | ICD-10-CM

## 2014-10-26 NOTE — Progress Notes (Signed)
NEUROLOGY FOLLOW UP OFFICE NOTE  Penny Bryant [id number]  HISTORY OF PRESENT ILLNESS: I had the pleasure of seeing Penny Bryant in follow-up in the neurology clinic on 10/26/2014.  The patient was last seen 2 months ago for right hemisensory changes that started in June 2016. MRI brain did not show acute infarct, but there was note of old right basal ganglia and right thalamic infarct. Echo showed mild LVH and grade 1 diastolic dysfunction, carotid dopplers did not show any significant stenosis. She denies any further worsening of symptoms except for residual numbness above her right ear. Today she reports episodes of head heaviness, where she feels muscle aching and pain on the right side of her neck that she feels her head is too heavy to hold up. Arms are unaffected. She has been told she occasionally sounds like her "mouth is full of rocks." She denies any dysphagia. She had muscle stiffness on Crestor, this has resolved with Lipitor. She feels unsteady when walking but denies any falls. She has started Prozac for anxiety, which has helped significantly. She is happy to report that she has stopped smoking, but states she has gained weight.  HPI: This is a pleasant 64 yo RH woman with vascular risk factors including hypertension, hyperlipidemia, tobacco use, with right-sided numbness that started last 07/06/2014. She reports being under a great deal of stress from family, she had been significantly stressed for 2 days when she woke up on 07/06/14 with the right side of her face and arm (up to wrist) feeling numb and tingly. The next day, she went to a graduation and noticed difficulties sitting on the chair, finding that the right buttock region was numb as well. Her right leg was unaffected. She denied any focal weakness. She felt a little confused that weekend. She saw her PCP and MRI was ordered, which was done last 07/28/14. I personally reviewed MRI brain with and without contrast which did not show any  acute infarct. There is an old infarct in the right basal ganglia and right thalamus with mild hemosiderin deposition. 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 She reports that symptoms started improving last weekend, the numbness in her right buttock region has resolved, there is only a 3-width band of numbness from the right vertex region down her right ear, ending in the mid-upper arm. She had bloodwork done showing an HbA1c of 6, lipid panel showed elevated total cholesterol 240, triglycerides 403. LDL could not be calculated due to triglyceride >400. She was started on Crestor, which she has been having difficulties with. She feels diffuse body weakness and fatigue, as well as cramps in her calves and back. She has neck stiffness and feels like her head is a "bowling ball sitting on top of a toothpick." She is concerned because she is usually active but states now she cannot get up and go anymore. She gets woozy when bending over.   PAST MEDICAL HISTORY: Past Medical History  Diagnosis Date  . Allergy     MEDICATIONS: Current Outpatient Prescriptions on File Prior to Visit  Medication Sig Dispense Refill  . atorvastatin (LIPITOR) 10 MG tablet Take 1/4th of this medication daily to see if your neck pain resolves. (Patient taking differently: Take 1/2 tablet daily) 30 tablet 3  . cetirizine (ZYRTEC) 10 MG tablet Take 10 mg by mouth daily.    Marland Kitchen FLUoxetine (PROZAC) 40 MG capsule Take 1 capsule (40 mg total) by mouth daily. 30  capsule 11  . losartan-hydrochlorothiazide (HYZAAR) 50-12.5 MG per tablet Take 1 tablet by mouth 2 (two) times daily. 180 tablet 0  . metoprolol tartrate (LOPRESSOR) 25 MG tablet Take 0.5 tablets (12.5 mg total) by mouth 2 (two) times daily. 30 tablet 3   No current facility-administered medications on file prior to visit.    ALLERGIES: Allergies  Allergen Reactions  . Sulfur Swelling    FAMILY HISTORY: Family History  Problem  Relation Age of Onset  . Diabetes Mother   . Hypertension Mother   . Stroke Mother   . Diabetes Father   . Hypertension Father   . Hypertension Sister     SOCIAL HISTORY: Social History   Social History  . Marital Status: Divorced    Spouse Name: N/A  . Number of Children: 3  . Years of Education: N/A   Occupational History  . Pharmacy Technician    Social History Main Topics  . Smoking status: Current Every Day Smoker -- 1.50 packs/day  . Smokeless tobacco: Never Used  . Alcohol Use: 1.8 oz/week    3 Glasses of wine per week  . Drug Use: No  . Sexual Activity: Not on file   Other Topics Concern  . Not on file   Social History Narrative    REVIEW OF SYSTEMS: Constitutional: No fevers, chills, or sweats, no generalized fatigue, change in appetite Eyes: No visual changes, double vision, eye pain Ear, nose and throat: No hearing loss, ear pain, nasal congestion, sore throat Cardiovascular: No chest pain, palpitations Respiratory:  No shortness of breath at rest or with exertion, wheezes GastrointestinaI: No nausea, vomiting, diarrhea, abdominal pain, fecal incontinence Genitourinary:  No dysuria, urinary retention or frequency Musculoskeletal:  + neck pain, back pain Integumentary: No rash, pruritus, skin lesions Neurological: as above Psychiatric: No depression, insomnia, anxiety Endocrine: No palpitations, fatigue, diaphoresis, mood swings, change in appetite, change in weight, increased thirst Hematologic/Lymphatic:  No anemia, purpura, petechiae. Allergic/Immunologic: no itchy/runny eyes, nasal congestion, recent allergic reactions, rashes  PHYSICAL EXAM: Filed Vitals:   10/26/14 1008  BP: 118/78  Pulse: 67  Resp: 16   General: No acute distress Head:  Normocephalic/atraumatic Neck: supple, +right paraspinal tenderness, full range of motion Heart:  Regular rate and rhythm Lungs:  Clear to auscultation bilaterally Back: No paraspinal  tenderness Skin/Extremities: No rash, no edema Neurological Exam: alert and oriented to person, place, and time. No aphasia or dysarthria. Fund of knowledge is appropriate.  Recent and remote memory are intact.  Attention and concentration are normal.    Able to name objects and repeat phrases. Cranial nerves: Pupils equal, round, reactive to light.  Fundoscopic exam unremarkable, no papilledema. Extraocular movements intact with no nystagmus. Visual fields full. Facial sensation intact except for decreased light touch on right temporal region above ear. No facial asymmetry. Tongue, uvula, palate midline.  Motor: Bulk and tone normal, muscle strength 5/5 throughout with no pronator drift.  Sensation to light touch intact.  No extinction to double simultaneous stimulation.  Deep tendon reflexes 2+ throughout, toes downgoing.  Finger to nose testing intact.  Gait slow and cautious, reporting right leg weakness and pain. Romberg negative.  IMPRESSION: This is a pleasant 64 yo RH woman with vascular risk factors including obesity, hypertension, hyperlipidemia, tobacco use, who presented with right face and arm numbness when she woke up with symptoms in June. Her MRI brain done a month after did not show any acute changes, there is a chronic infarct in the right  basal ganglia and thalamus, she likely had an MRI-negative left thalamic infarct causing sensory symptoms. Stroke workup unremarkable. She has stopped smoking. BP today 118/78. Continue daily aspirin and control of vascular risk factors. Her main complaint today is head heaviness from right-sided neck pain, she is also noted to be weaker ambulating with her right leg, and will be referred for physical therapy for neck pain and right leg weakness. She will follow-up in  3 months and knows to call our office for any changes.   Thank you for allowing me to participate in her care.  Please do not hesitate to call for any questions or concerns.  The duration  of this appointment visit was 24 minutes of face-to-face time with the patient.  Greater than 50% of this time was spent in counseling, explanation of diagnosis, planning of further management, and coordination of care.   Patrcia Dolly, M.D.   CC: Dr. Chestine Spore

## 2014-10-26 NOTE — Patient Instructions (Signed)
1. Refer to PT for neck pain and right leg weakness 2. Continue daily aspirin, control of BP and cholesterol 3. Good job with stopping smoking! 4. Follow-up in 3 months, call for any changes

## 2014-10-26 NOTE — Telephone Encounter (Signed)
Pt called stating she woke up this morning and her lip was still swollen. I advised pt to come back in to be seen or go to the emergency room. Pt states she will just take a antihistamine because she is going to [place]. She wanted to let Deliah Boston know;.

## 2014-10-26 NOTE — Telephone Encounter (Signed)
Pt called and wanted Penny Bryant to know that she saw her Neurologist today and she suggested that she doesn't need a shot she should just take some benadryl and she'll be fine.

## 2014-10-29 DIAGNOSIS — M542 Cervicalgia: Secondary | ICD-10-CM | POA: Insufficient documentation

## 2014-11-15 ENCOUNTER — Ambulatory Visit: Payer: Medicare Other | Attending: Neurology | Admitting: Physical Therapy

## 2014-11-15 DIAGNOSIS — M542 Cervicalgia: Secondary | ICD-10-CM | POA: Diagnosis not present

## 2014-11-15 DIAGNOSIS — R293 Abnormal posture: Secondary | ICD-10-CM | POA: Diagnosis not present

## 2014-11-15 DIAGNOSIS — R29898 Other symptoms and signs involving the musculoskeletal system: Secondary | ICD-10-CM | POA: Insufficient documentation

## 2014-11-15 DIAGNOSIS — R2681 Unsteadiness on feet: Secondary | ICD-10-CM | POA: Diagnosis not present

## 2014-11-15 DIAGNOSIS — M5382 Other specified dorsopathies, cervical region: Secondary | ICD-10-CM | POA: Diagnosis not present

## 2014-11-15 NOTE — Patient Instructions (Signed)
   Daxton Nydam PT, DPT, LAT, ATC  [facility] Phone: [phone]     

## 2014-11-15 NOTE — Therapy (Addendum)
[place], [place], [address] Phone: [phone]   Fax:  [phone]  Physical Therapy Evaluation  Patient Details  Name: Penny Bryant MRN: [id number] Date of Birth: [birth year] Referring Provider: Cameron Sprang MD  Encounter Date: 11/15/2014      PT End of Session - 11/15/14 1443    Visit Number 1   Number of Visits 8   Date for PT Re-Evaluation 02/07/15   Authorization Type Medicare, Kx modifier by 15th visit and progress note by 9th visit   PT Start Time 1330   PT Stop Time 1415   PT Time Calculation (min) 45 min   Activity Tolerance Patient tolerated treatment well   Behavior During Therapy [facility] for tasks assessed/performed      Past Medical History  Diagnosis Date  . Allergy     Past Surgical History  Procedure Laterality Date  . Cholecystectomy Bilateral     There were no vitals filed for this visit.  Visit Diagnosis:  Neck pain - Plan: PT plan of care cert/re-cert  Neck muscle weakness - Plan: PT plan of care cert/re-cert  Abnormal posture - Plan: PT plan of care cert/re-cert  Bilateral leg weakness - Plan: PT plan of care cert/re-cert  Unsteadiness - Plan: PT plan of care cert/re-cert      Subjective Assessment - 11/15/14 1339    Subjective pt is a 64 y.o F with CC of neck pain since she as had her 2 strokes most recent being in may/2016 and an arterial infarction. She reports the R leg weakness is due the strokes as well. since the stroke she reports that the symptoms have stayed the same since onset.    Limitations Standing;Walking;House hold activities;Sitting;Lifting   How long can you sit comfortably? 30 min   How long can you stand comfortably? 60 min   How long can you walk comfortably? 60 min   Diagnostic tests 07/28/2014 Old infarctions in the right basal ganglia    Patient Stated Goals to keep the head up, and to be able to drive longer   Currently in Pain? Yes   Pain  Score 5   4/10 in the morning   Pain Location Neck   Pain Orientation Right;Left   Pain Descriptors / Indicators Aching   Pain Type Chronic pain   Pain Onset More than a month ago   Pain Frequency Constant   Aggravating Factors  sitting up/ and holding the head up straight   Pain Relieving Factors laying down   Multiple Pain Sites Yes   Pain Score 0   Pain Location Leg   Pain Orientation Right   Pain Descriptors / Indicators Pins and needles  weakness   Pain Type Chronic pain   Pain Onset More than a month ago   Pain Frequency Constant   Aggravating Factors  cramping   Pain Relieving Factors laying down            [facility] PT Assessment - 11/15/14 1348    Assessment   Medical Diagnosis neck pain and R leg weakness   Referring Provider Cameron Sprang MD   Onset Date/Surgical Date --  May 2016   Hand Dominance Right   Next MD Visit december 2016   Prior Therapy no   Precautions   Precautions None   Restrictions   Weight Bearing Restrictions No   Balance Screen   Has the patient fallen in the past 6 months Yes  How many times? 3   Has the patient had a decrease in activity level because of a fear of falling?  Yes   Is the patient reluctant to leave their home because of a fear of falling?  Yes   [place] residence   Prior Function   Level of Independence Independent;Independent with basic ADLs   Vocation Retired   Charity fundraiser Status Within Functional Limits for tasks assessed   Observation/Other Assessments   Focus on Therapeutic Outcomes (FOTO)  37% limited  predicted 32% limited   Posture/Postural Control   Posture/Postural Control Postural limitations   Postural Limitations Rounded Shoulders;Forward head   ROM / Strength   AROM / PROM / Strength AROM;PROM;Strength   AROM   AROM Assessment Site Knee;Hip;Cervical   Right/Left Hip Right   Right/Left Knee Right   Cervical Flexion 40   Cervical Extension  38   Cervical - Right Side Bend 40  uncomfortable during movement   Cervical - Left Side Bend 42  uncomfortable during movement   Cervical - Right Rotation 45   Cervical - Left Rotation 45   Strength   Strength Assessment Site Knee;Hip   Right/Left Hip Right;Left   Right Hip Flexion 4-/5   Right Hip Extension 4-/5   Right Hip ABduction 4/5   Right Hip ADduction 4/5   Left Hip Flexion 4/5   Left Hip Extension 4/5   Left Hip ABduction 4/5   Left Hip ADduction 4/5   Right/Left Knee Right;Left   Right Knee Flexion 4-/5   Right Knee Extension 4-/5  pain during testing   Left Knee Flexion 4-/5   Left Knee Extension 3+/5   Palpation   Palpation comment tenderenss in the patellar tendon of the right knee. in the cervical spine tenderness in the upper traps and scalenes and sub-occipitals.    Ambulation/Gait   Gait Pattern Step-through pattern;Decreased stride length;Antalgic  with [facility]                           PT Education - 11/15/14 1440    Education provided Yes   Education Details evaluation findings, POC, HEP, Goals   Person(s) Educated Patient   Methods Explanation   Comprehension Verbalized understanding          PT Short Term Goals - 11/15/14 1449    PT SHORT TERM GOAL #1   Title pt will be I with basic HEP (12/16/2014)   Time 4   Period Weeks   Status New   PT SHORT TERM GOAL #2   Title pt will demonstrate decreased pain to < 3/10 during and following cervical mobility to help with functional progression (12/16/2014)   Time 4   Period Weeks   Status New   PT SHORT TERM GOAL #3   Title pt will increase Bil LE strength to > 4/5 to help promote safety during amb (12/16/2014)   Time 4   Period Weeks   Status New   PT SHORT TERM GOAL #4   Title She will be able to verbalize and demonstrate techniques to reduce neck pain via postural awarness, lifting and carrying mechanics, and HEP (12/16/2014)   Time 4   Period Weeks   Status New   PT  SHORT TERM GOAL #5   Title assess berg balance and make goals based on assessment (12/16/2014)   Time 4  PT Long Term Goals - 12-04-2014 1452    PT LONG TERM GOAL #1   Title pt will be I with all HEP given throughout therapy (02/07/2015)   Time 8   Period Weeks   Status New   PT LONG TERM GOAL #2   Title pt will demonstrate full functional cervical mobility with < 2/10 pain to help with safety and endurance during driving (8/84/1660)   Time 8   Period Weeks   Status New   PT LONG TERM GOAL #3   Title pt will demonstrate increased strength in bil LE to atleast 4+/5 to promote safety during amb (02/07/2015)   Time 8   Period Weeks   Status New   PT LONG TERM GOAL #4   Title pt will increase her FOTO score to > 68 to demonstrate imporvement at discharge (02/07/2015)   Time [place] - 12/04/14 1444    Clinical Impression Statement Penny Bryant reports to OPPT with CC of neck pain and weakness as well as R leg weakness. She reports having a stroke back in May of 2016 and it caused the neck and leg problems. she demonstrates limited cervical mobility secondary to tightness and with rotation and sidebending. palpation revealed tightness of the sub-occipitals, upper trap and scalenes.  bil LE demonstrate weakness with pain during testing, with pain noted around the patellar tendon. She repors a hx of falling and plan to assess balance next visit. She would benefit from physical therapy to decreased pain and regturn to PLOF by addressing the impairments listed.    Pt will benefit from skilled therapeutic intervention in order to improve on the following deficits Postural dysfunction;Improper body mechanics;Pain;Decreased strength;Hypomobility;Decreased balance;Decreased activity tolerance;Decreased endurance;Decreased safety awareness;Decreased range of motion   Rehab Potential Good   PT Frequency 1x / week  every other week   PT Duration 8  weeks   PT Treatment/Interventions ADLs/Self Care Home Management;Cryotherapy;Electrical Stimulation;Iontophoresis 64m/ml Dexamethasone;Moist Heat;Ultrasound;Therapeutic activities;Therapeutic exercise;Manual techniques;Taping;Dry needling;Patient/family education;Passive range of motion   PT Next Visit Plan assess response to HEP, cervical stability, posture education, LE strengthening, berg balance assessment   PT Home Exercise Plan see HEP handout   Consulted and Agree with Plan of Care Patient          G-Codes - 111/07/161456    Functional Assessment Tool Used FOTO 37% limited   Functional Limitation Changing and maintaining body position   Changing and Maintaining Body Position Current Status ((Y3016 At least 20 percent but less than 40 percent impaired, limited or restricted   Changing and Maintaining Body Position Goal Status ((W1093 At least 1 percent but less than 20 percent impaired, limited or restricted       Problem List Patient Active Problem List   Diagnosis Date Noted  . Neck pain 10/29/2014  . Anxiety and depression 08/28/2014  . [place] 08/08[place]2014  . Arterial ischemic stroke, vertebrobasilar, thalamic, acute ([place] 08/08/2014  . Hyperlipidemia 08/08/2014  . Tobacco use disorder 08/08/2014  . Obesity 08/08/2014  . Essential hypertension 07/06/2014   KStarr LakePT, DPT, LAT, ATC  1Nov 07, 2016 3:00 PM      [facility] [address]Phone: [phone]  Fax:  [phone] Name: Penny BIRKHEADMRN: [id number] of Birth: 914-Apr-1952  PHYSICAL THERAPY DISCHARGE SUMMARY  [facility]  Current functional level related to goals / functional outcomes: See goals   Remaining deficits: Unknown due to not returning   Education / Equipment: HEP  Plan:                                                    Patient goals were not met. Patient is being  discharged due to not returning since the last visit.  ?????        Bonita Brindisi PT, DPT, LAT, ATC  01/19/2015  7:57 AM

## 2014-11-22 ENCOUNTER — Other Ambulatory Visit: Payer: Self-pay | Admitting: Physician Assistant

## 2014-11-29 ENCOUNTER — Ambulatory Visit: Payer: Medicare Other | Admitting: Physical Therapy

## 2014-12-13 ENCOUNTER — Encounter: Payer: Medicare Other | Admitting: Physical Therapy

## 2014-12-27 ENCOUNTER — Encounter: Payer: Medicare Other | Admitting: Physical Therapy

## 2015-01-08 ENCOUNTER — Encounter: Payer: Medicare Other | Admitting: Physical Therapy

## 2015-01-10 ENCOUNTER — Ambulatory Visit: Payer: Medicare Other | Admitting: Physician Assistant

## 2015-01-24 ENCOUNTER — Encounter: Payer: Medicare Other | Admitting: Physical Therapy

## 2015-01-26 ENCOUNTER — Ambulatory Visit: Payer: Medicare Other | Admitting: Neurology

## 2015-02-08 ENCOUNTER — Telehealth: Payer: Self-pay

## 2015-02-08 NOTE — Telephone Encounter (Signed)
Patient wants Penny Bryant to know that she is doing fine and she hasn't forgotten about him. [place]

## 2015-02-14 ENCOUNTER — Ambulatory Visit: Payer: Medicare Other | Admitting: Physician Assistant

## 2015-03-07 ENCOUNTER — Other Ambulatory Visit: Payer: Self-pay | Admitting: Physician Assistant

## 2015-03-24 ENCOUNTER — Other Ambulatory Visit: Payer: Self-pay | Admitting: Physician Assistant

## 2015-05-24 ENCOUNTER — Other Ambulatory Visit: Payer: Self-pay | Admitting: Physician Assistant

## 2015-06-05 ENCOUNTER — Other Ambulatory Visit: Payer: Self-pay | Admitting: Physician Assistant

## 2015-07-04 ENCOUNTER — Other Ambulatory Visit: Payer: Self-pay | Admitting: Physician Assistant

## 2015-07-04 ENCOUNTER — Telehealth: Payer: Self-pay | Admitting: *Deleted

## 2015-07-04 NOTE — Telephone Encounter (Signed)
No more refills until she has a follow up visit/labs

## 2015-07-05 ENCOUNTER — Other Ambulatory Visit: Payer: Self-pay | Admitting: Physician Assistant

## 2015-07-11 ENCOUNTER — Ambulatory Visit (INDEPENDENT_AMBULATORY_CARE_PROVIDER_SITE_OTHER): Payer: Medicare Other | Admitting: Physician Assistant

## 2015-07-11 VITALS — BP 126/72 | HR 74 | Temp 98.2°F | Resp 18 | Ht 67.0 in | Wt 242.2 lb

## 2015-07-11 DIAGNOSIS — Z1159 Encounter for screening for other viral diseases: Secondary | ICD-10-CM | POA: Diagnosis not present

## 2015-07-11 DIAGNOSIS — F4323 Adjustment disorder with mixed anxiety and depressed mood: Secondary | ICD-10-CM | POA: Diagnosis not present

## 2015-07-11 DIAGNOSIS — I10 Essential (primary) hypertension: Secondary | ICD-10-CM | POA: Diagnosis not present

## 2015-07-11 DIAGNOSIS — Z760 Encounter for issue of repeat prescription: Secondary | ICD-10-CM

## 2015-07-11 DIAGNOSIS — Z1329 Encounter for screening for other suspected endocrine disorder: Secondary | ICD-10-CM

## 2015-07-11 DIAGNOSIS — F17200 Nicotine dependence, unspecified, uncomplicated: Secondary | ICD-10-CM

## 2015-07-11 DIAGNOSIS — Z23 Encounter for immunization: Secondary | ICD-10-CM

## 2015-07-11 DIAGNOSIS — Z139 Encounter for screening, unspecified: Secondary | ICD-10-CM

## 2015-07-11 DIAGNOSIS — Z131 Encounter for screening for diabetes mellitus: Secondary | ICD-10-CM | POA: Diagnosis not present

## 2015-07-11 DIAGNOSIS — Z79899 Other long term (current) drug therapy: Secondary | ICD-10-CM | POA: Diagnosis not present

## 2015-07-11 LAB — HEMOGLOBIN A1C
Hgb A1c MFr Bld: 7.2 % — ABNORMAL HIGH (ref <5.7)
Mean Plasma Glucose: 160 mg/dL

## 2015-07-11 MED ORDER — LOSARTAN POTASSIUM-HCTZ 50-12.5 MG PO TABS: 1.0000 | ORAL_TABLET | Freq: Every day | ORAL | Status: DC

## 2015-07-11 MED ORDER — FLUOXETINE HCL 40 MG PO CAPS: 40.0000 mg | ORAL_CAPSULE | Freq: Every day | ORAL | Status: DC

## 2015-07-11 MED ORDER — ATORVASTATIN CALCIUM 10 MG PO TABS: 10.0000 mg | ORAL_TABLET | Freq: Every day | ORAL | Status: DC

## 2015-07-11 MED ORDER — METOPROLOL TARTRATE 25 MG PO TABS: 12.5000 mg | ORAL_TABLET | Freq: Two times a day (BID) | ORAL | Status: DC

## 2015-07-11 MED ORDER — PNEUMOCOCCAL VAC POLYVALENT 25 MCG/0.5ML IJ INJ: 0.5000 mL | INJECTION | Freq: Once | INTRAMUSCULAR | Status: DC

## 2015-07-11 NOTE — Progress Notes (Signed)
07/11/2015 5:04 PM   DOB: 13-Apr-1950 / MRN: [id number]  SUBJECTIVE:  Penny Bryant is a 65 y.o. female with a history of CVA for medication refills.  She is doing well.  Has lots of stress in her life but says the prozac is helping.  Denies any weakness today, and her physically abilities continue to improve, particularly with having to help her mother who is living with her and chronically ill.  She reports she was able to quit smoking for 6 months but started back recently.  She plans to quit again soon.  She is taking all of her meds as prescribed.   Immunization History  Administered Date(s) Administered  . Influenza-Unspecified 09/28/2014  . Pneumococcal Conjugate-13 08/28/2014  . Zoster 07/07/2014     She is allergic to sulfur.   She  has a past medical history of Allergy.    She  reports that she has quit smoking. She has never used smokeless tobacco. She reports that she drinks about 1.8 oz of alcohol per week. She reports that she does not use illicit drugs. She  has no sexual activity history on file. The patient  has past surgical history that includes Cholecystectomy (Bilateral).  Her family history includes Diabetes in her father and mother; Hypertension in her father, mother, and sister; Stroke in her mother.  Review of Systems  Constitutional: Negative for fever and chills.  Respiratory: Negative for cough, sputum production and shortness of breath.   Cardiovascular: Negative for chest pain.  Gastrointestinal: Negative for nausea.  Skin: Negative for rash.  Neurological: Negative for dizziness, tremors, speech change, focal weakness and headaches.  Endo/Heme/Allergies: Negative for polydipsia.  Psychiatric/Behavioral: Negative for depression.    Problem list and medications reviewed and updated by myself where necessary, and exist elsewhere in the encounter.   OBJECTIVE:  BP 126/72 mmHg  Pulse 74  Temp(Src) 98.2 F (36.8 C) (Oral)  Resp 18  Ht  (1.702 m)   Wt 242 lb 3.2 oz (109.861 kg)  BMI 37.92 kg/m2  SpO2 96%  Physical Exam  Constitutional: She is oriented to person, place, and time. She appears well-nourished. No distress.  Eyes: EOM are normal. Pupils are equal, round, and reactive to light.  Cardiovascular: Normal rate and regular rhythm.   Pulmonary/Chest: Effort normal and breath sounds normal.  Abdominal: She exhibits no distension.  Neurological: She is alert and oriented to person, place, and time. No cranial nerve deficit. Gait normal.  Skin: Skin is dry. She is not diaphoretic.  Psychiatric: She has a normal mood and affect.  Vitals reviewed.   No results found for this or any previous visit (from the past 72 hour(s)).  No results found.  ASSESSMENT AND PLAN  Penny Bryant was seen today for medication refill.  Diagnoses and all orders for this visit:  Adjustment disorder with mixed anxiety and depressed mood -     FLUoxetine (PROZAC) 40 MG capsule; Take 1 capsule (40 mg total) by mouth daily. -     Cancel: Pneumococcal polysaccharide vaccine 23-valent greater than or equal to 2yo subcutaneous/IM  Medication refill -     atorvastatin (LIPITOR) 10 MG tablet; Take 1 tablet (10 mg total) by mouth daily at 6 PM. -     Lipid panel -     COMPLETE METABOLIC PANEL WITH GFR  Essential hypertension -     losartan-hydrochlorothiazide (HYZAAR) 50-12.5 MG tablet; Take 1 tablet by mouth daily. -     metoprolol tartrate (LOPRESSOR) 25  MG tablet; Take 0.5 tablets (12.5 mg total) by mouth 2 (two) times daily.  Tobacco use disorder  Screening -     TSH -     Hemoglobin A1c -     Hepatitis C antibody  Need for prophylactic vaccination against Streptococcus pneumoniae (pneumococcus) -     pneumococcal 23 valent vaccine (PNU-IMMUNE) 25 MCG/0.5ML injection; Inject 0.5 mLs into the muscle once.    The patient was advised to call or return to clinic if she does not see an improvement in symptoms or to seek the care of the closest  emergency department if she worsens with the above plan.   Deliah BostonMichael Bueford Arp, MHS, PA-C [facility] 07/11/2015 5:04 PM

## 2015-07-11 NOTE — Patient Instructions (Signed)
     IF you received an x-ray today, you will receive an invoice from [facility]. Please contact  Radiology at [phone] with questions or concerns regarding your invoice.   IF you received labwork today, you will receive an invoice from [facility]. Please contact [facility] at [phone] with questions or concerns regarding your invoice.   Our billing staff will not be able to assist you with questions regarding bills from these companies.  You will be contacted with the lab results as soon as they are available. The fastest way to get your results is to activate your My Chart account. Instructions are located on the last page of this paperwork. If you have not heard from us regarding the results in 2 weeks, please contact this office.      

## 2015-07-12 LAB — COMPLETE METABOLIC PANEL WITH GFR
ALBUMIN: 4.5 g/dL (ref 3.6–5.1)
ALK PHOS: 70 U/L (ref 33–130)
ALT: 25 U/L (ref 6–29)
AST: 22 U/L (ref 10–35)
BUN: 15 mg/dL (ref 7–25)
CO2: 20 mmol/L (ref 20–31)
Calcium: 10.2 mg/dL (ref 8.6–10.4)
Chloride: 98 mmol/L (ref 98–110)
Creat: 0.79 mg/dL (ref 0.50–0.99)
GFR, EST NON AFRICAN AMERICAN: 78 mL/min (ref >=60)
GFR, Est African American: >89 mL/min (ref >=60)
GLUCOSE: 128 mg/dL — AB (ref 65–99)
POTASSIUM: 3.9 mmol/L (ref 3.5–5.3)
SODIUM: 135 mmol/L (ref 135–146)
Total Bilirubin: 0.4 mg/dL (ref 0.2–1.2)
Total Protein: 8.3 g/dL — ABNORMAL HIGH (ref 6.1–8.1)

## 2015-07-12 LAB — TSH: TSH: 1.83 mIU/L

## 2015-07-12 LAB — LIPID PANEL
CHOL/HDL RATIO: 5.1 ratio — AB (ref <=5.0)
Cholesterol: 246 mg/dL — ABNORMAL HIGH (ref 125–200)
HDL: 48 mg/dL (ref >=46)
LDL CALC: 132 mg/dL — AB (ref <130)
Triglycerides: 330 mg/dL — ABNORMAL HIGH (ref <150)
VLDL: 66 mg/dL — AB (ref <30)

## 2015-07-12 LAB — HEPATITIS C ANTIBODY: HCV AB: NEGATIVE

## 2015-08-01 NOTE — Telephone Encounter (Signed)
Explained results, Patient understood

## 2015-09-06 ENCOUNTER — Other Ambulatory Visit: Payer: Self-pay | Admitting: Physician Assistant

## 2015-09-18 ENCOUNTER — Telehealth: Payer: Self-pay

## 2015-09-18 DIAGNOSIS — I10 Essential (primary) hypertension: Secondary | ICD-10-CM

## 2015-09-18 NOTE — Telephone Encounter (Signed)
Pt is stating that the losartan was called in wrong she is used to taking 2 a day and the refill states 1 a day   Best number [phone]

## 2015-09-20 MED ORDER — LOSARTAN POTASSIUM-HCTZ 50-12.5 MG PO TABS: 1.0000 | ORAL_TABLET | Freq: Two times a day (BID) | ORAL | 1 refills | Status: DC

## 2015-09-20 NOTE — Telephone Encounter (Signed)
Casimiro NeedleMichael, this sig for losartan seems to have changed at 07/11/15, but I don't see any notes that you intentionally decreased dose. Losartan BID is in the changed med list on OV notes, and the QD dose sig is under plan for visit, but I don't see any notes where you instr'd pt to decrease dose or reason for change. Did you intend for pt to only take QD now, or want me to send in a new Rx for BID as she had been on prior to that 6/14 OV?

## 2015-09-20 NOTE — Telephone Encounter (Signed)
Britta MccreedyBarbara,  Thank you for investigating.  Let's keep her dose the way she is used to taking it at BID.  Please call the pharmacy and change, and change in EPIC as well.  Thank you again.   Deliah BostonMichael Oanh Devivo, MS, PA-C 1:44 PM, 09/20/2015

## 2015-09-20 NOTE — Telephone Encounter (Signed)
Sent in corrected Rx and asked pharm to DC any remaining refills of QD dosing. LMOM for pt advising her of this and asked her to double check her Rx when she picks it up to make sure it is for the correct BID dosing.

## 2015-12-23 ENCOUNTER — Other Ambulatory Visit: Payer: Self-pay | Admitting: Physician Assistant

## 2016-02-01 ENCOUNTER — Other Ambulatory Visit: Payer: Self-pay | Admitting: Physician Assistant

## 2016-02-01 DIAGNOSIS — I10 Essential (primary) hypertension: Secondary | ICD-10-CM

## 2016-03-13 ENCOUNTER — Other Ambulatory Visit: Payer: Self-pay | Admitting: Physician Assistant

## 2016-03-13 DIAGNOSIS — I10 Essential (primary) hypertension: Secondary | ICD-10-CM

## 2016-03-17 ENCOUNTER — Other Ambulatory Visit: Payer: Self-pay | Admitting: Physician Assistant

## 2016-03-17 DIAGNOSIS — I10 Essential (primary) hypertension: Secondary | ICD-10-CM

## 2016-04-02 IMAGING — CR DG CERVICAL SPINE COMPLETE 4+V
6 series · 6 of 6 positions shown · non-contrast
Comparison: None.

CLINICAL DATA: Cervicalgia/neck pain of unknown origin. No injury.
Initial encounter. RIGHT arm paresthesias.

EXAM:
CERVICAL SPINE  4+ VIEWS

[lateral]
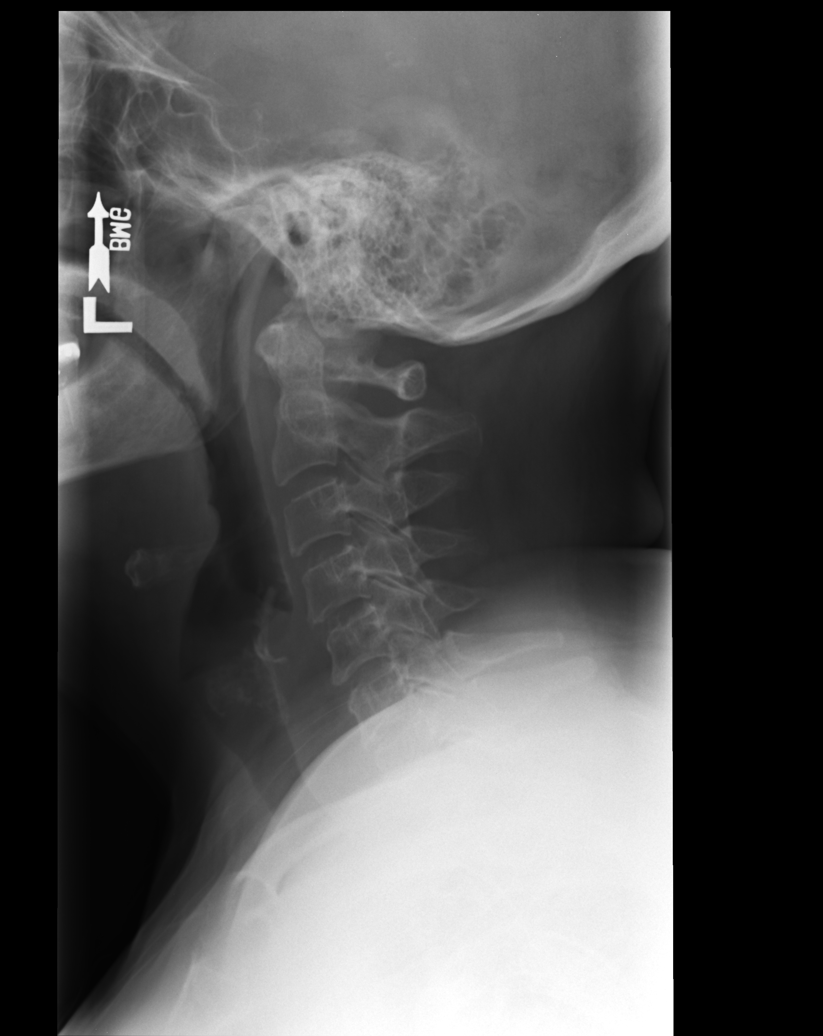

[ap open mouth]
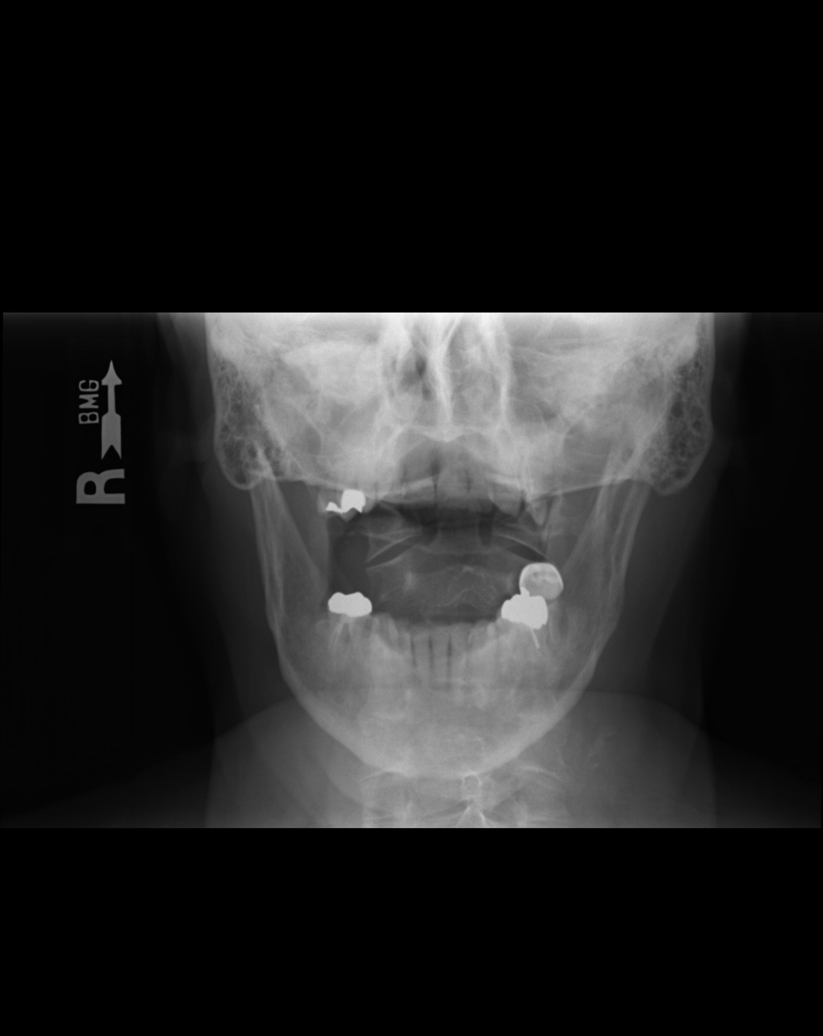

[AP]
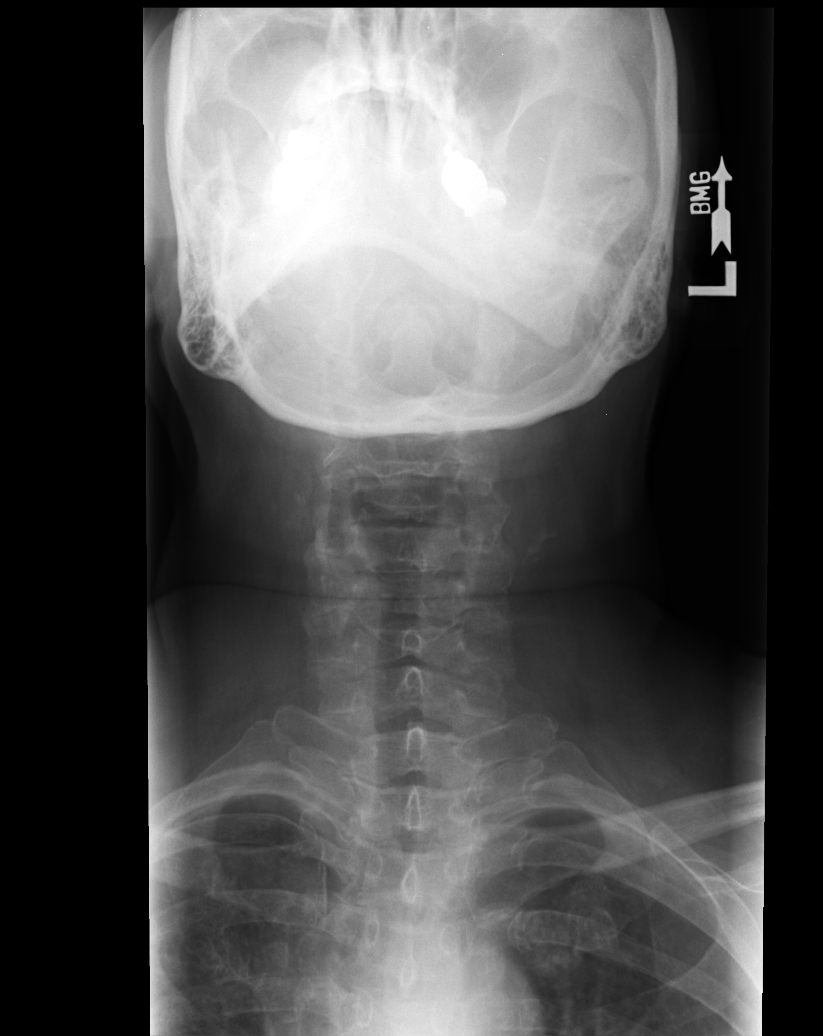

[lpo]
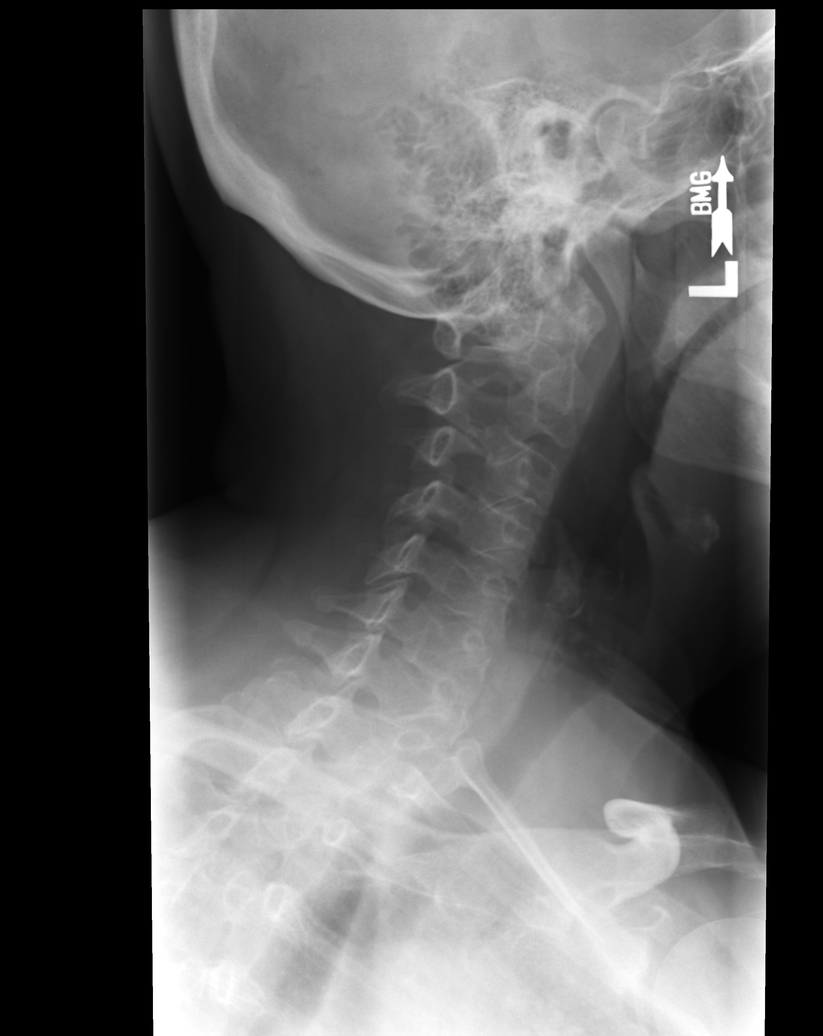

[rpo]
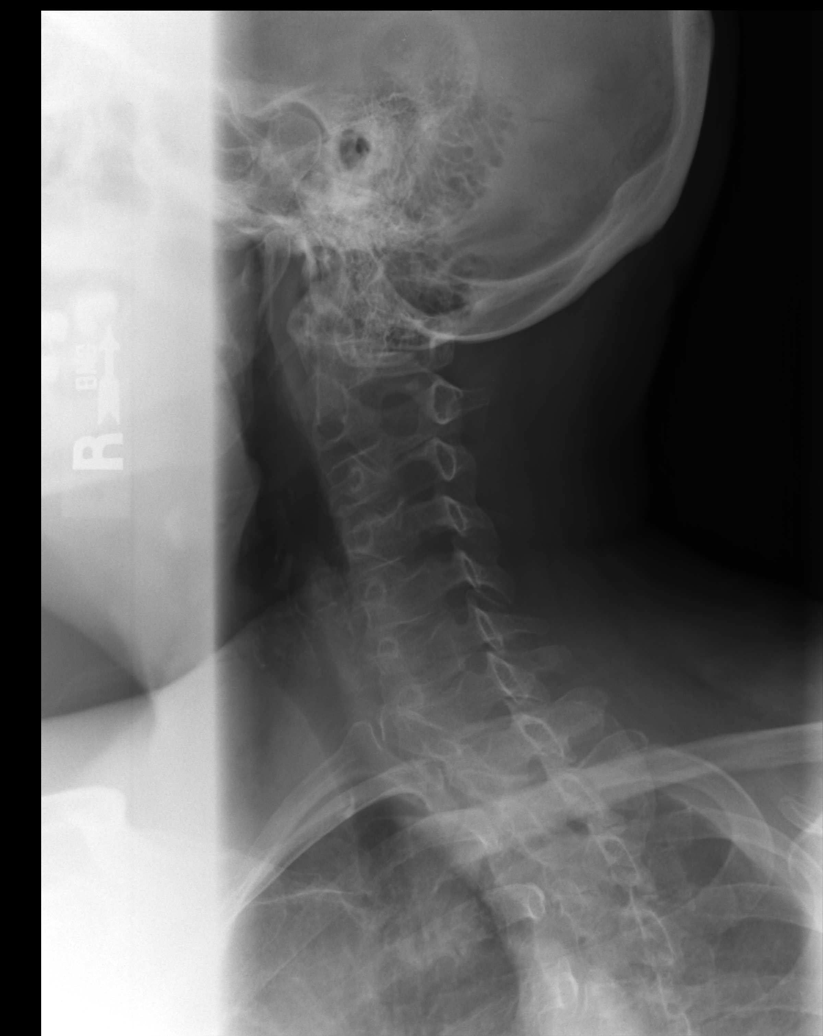

[swimmers]
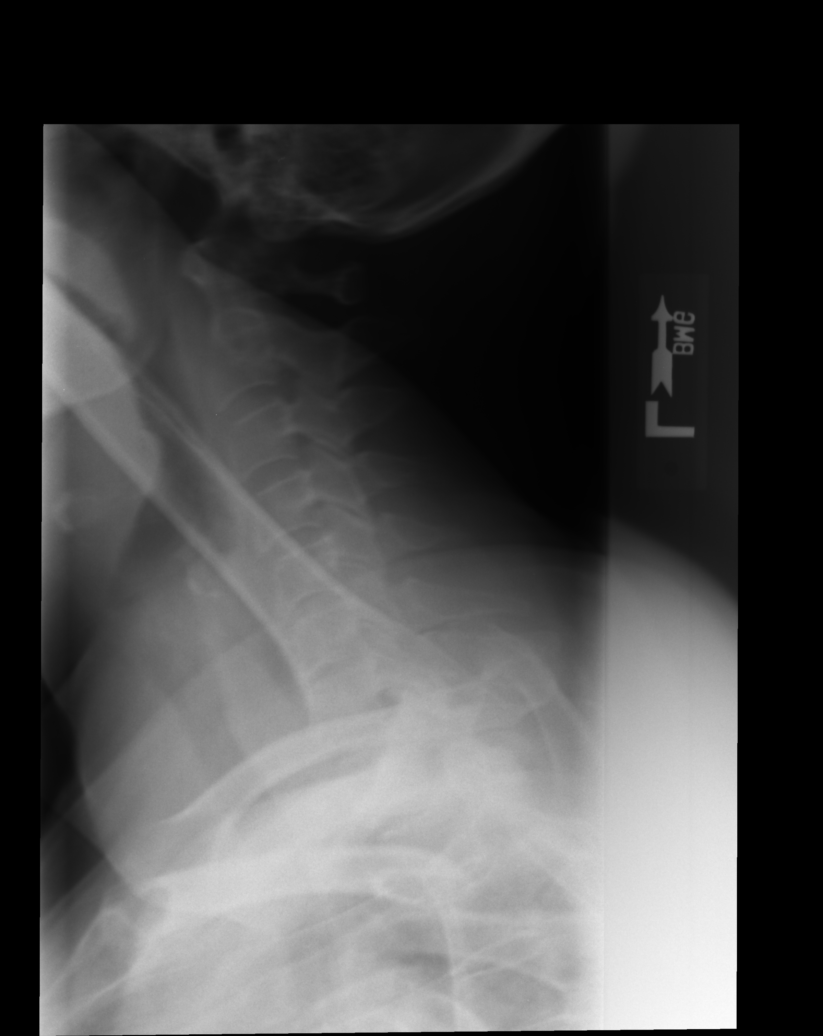

[6 of 6 positions shown; findings below may reference images not displayed]

FINDINGS: Moderate lower cervical spondylosis is present, most pronounced at
C6-C7. Cervicothoracic junction is not visible. The odontoid appears
normal. No cervical spine fracture. Carotid atherosclerosis. Neural
foramina appear patent. Swimmer's view was attempted however this
does not adequately demonstrate the cervicothoracic junction.
IMPRESSION: Moderate lower cervical spondylosis.  No acute abnormality.

## 2016-04-11 ENCOUNTER — Other Ambulatory Visit: Payer: Self-pay | Admitting: Physician Assistant

## 2016-04-11 NOTE — Telephone Encounter (Signed)
[place]oke with [place]. [place] mother who has dementia. Expects to be back in late May.  Also wants to Mr. Chestine SporeClark, PA-C to know that she has quit smoking.  Advised to contact the office as soon as she knows the date she'll be home to schedule follow-up and fasting labs with Mr. Chestine SporeClark.  Meds ordered this encounter  Medications  . atorvastatin (LIPITOR) 10 MG tablet    Sig: TAKE 1 TABLET BY MOUTH EVERY DAY AT 6PM    Dispense:  90 tablet    Refill:  0

## 2016-04-12 NOTE — Telephone Encounter (Signed)
Agreed.  Glad to hear she has finally stopped smoking. Thank you Chelle.  Penny BostonMichael Lonnel Gjerde, MS, PA-C 4:56 PM, 04/12/2016

## 2016-04-23 ENCOUNTER — Other Ambulatory Visit: Payer: Self-pay | Admitting: Physician Assistant

## 2016-04-23 DIAGNOSIS — I10 Essential (primary) hypertension: Secondary | ICD-10-CM

## 2016-04-24 NOTE — Telephone Encounter (Signed)
Pt needs and appt with Chestine Spore within a month

## 2016-05-21 ENCOUNTER — Other Ambulatory Visit: Payer: Self-pay | Admitting: Physician Assistant

## 2016-05-21 DIAGNOSIS — I10 Essential (primary) hypertension: Secondary | ICD-10-CM

## 2016-05-26 ENCOUNTER — Other Ambulatory Visit: Payer: Self-pay | Admitting: Physician Assistant

## 2016-05-26 DIAGNOSIS — I10 Essential (primary) hypertension: Secondary | ICD-10-CM

## 2016-06-27 ENCOUNTER — Telehealth: Payer: Self-pay

## 2016-06-27 NOTE — Telephone Encounter (Signed)
Called pt to schedule Medicare Annual Wellness Visit. -nr  

## 2016-07-19 ENCOUNTER — Other Ambulatory Visit: Payer: Self-pay | Admitting: Physician Assistant

## 2016-08-30 ENCOUNTER — Other Ambulatory Visit: Payer: Self-pay | Admitting: Physician Assistant

## 2016-09-01 NOTE — Telephone Encounter (Signed)
Letter mailed to patient about making an appointment for more medication refills. °

## 2016-09-16 ENCOUNTER — Telehealth: Payer: Self-pay

## 2016-09-16 NOTE — Telephone Encounter (Signed)
Called pt to schedule Medicare Annual Wellness Visit with nurse health advisor. Per pt's last refill note pt needs office visit follow up with pcp as well. Can schedule both on same day if OK with pt. -nr

## 2016-09-17 ENCOUNTER — Other Ambulatory Visit: Payer: Self-pay | Admitting: Physician Assistant

## 2016-09-17 DIAGNOSIS — F4323 Adjustment disorder with mixed anxiety and depressed mood: Secondary | ICD-10-CM

## 2016-09-18 DIAGNOSIS — I10 Essential (primary) hypertension: Secondary | ICD-10-CM | POA: Diagnosis not present

## 2016-09-18 DIAGNOSIS — K219 Gastro-esophageal reflux disease without esophagitis: Secondary | ICD-10-CM | POA: Diagnosis not present

## 2016-09-18 DIAGNOSIS — E119 Type 2 diabetes mellitus without complications: Secondary | ICD-10-CM | POA: Diagnosis not present

## 2016-09-18 DIAGNOSIS — E785 Hyperlipidemia, unspecified: Secondary | ICD-10-CM | POA: Diagnosis not present

## 2016-09-18 DIAGNOSIS — R81 Glycosuria: Secondary | ICD-10-CM | POA: Diagnosis not present

## 2016-09-20 ENCOUNTER — Other Ambulatory Visit: Payer: Self-pay | Admitting: Physician Assistant

## 2016-09-26 DIAGNOSIS — E119 Type 2 diabetes mellitus without complications: Secondary | ICD-10-CM | POA: Diagnosis not present

## 2016-09-26 DIAGNOSIS — I10 Essential (primary) hypertension: Secondary | ICD-10-CM | POA: Diagnosis not present

## 2016-09-26 DIAGNOSIS — E559 Vitamin D deficiency, unspecified: Secondary | ICD-10-CM | POA: Diagnosis not present

## 2016-09-26 DIAGNOSIS — E7800 Pure hypercholesterolemia, unspecified: Secondary | ICD-10-CM | POA: Diagnosis not present

## 2016-10-07 DIAGNOSIS — R7989 Other specified abnormal findings of blood chemistry: Secondary | ICD-10-CM | POA: Diagnosis not present

## 2016-10-24 DIAGNOSIS — R5383 Other fatigue: Secondary | ICD-10-CM | POA: Diagnosis not present

## 2016-10-24 DIAGNOSIS — R251 Tremor, unspecified: Secondary | ICD-10-CM | POA: Diagnosis not present

## 2016-10-24 DIAGNOSIS — E119 Type 2 diabetes mellitus without complications: Secondary | ICD-10-CM | POA: Diagnosis not present

## 2016-10-24 DIAGNOSIS — I10 Essential (primary) hypertension: Secondary | ICD-10-CM | POA: Diagnosis not present

## 2016-11-14 DIAGNOSIS — I10 Essential (primary) hypertension: Secondary | ICD-10-CM | POA: Diagnosis not present

## 2016-11-14 DIAGNOSIS — E119 Type 2 diabetes mellitus without complications: Secondary | ICD-10-CM | POA: Diagnosis not present

## 2016-11-20 ENCOUNTER — Other Ambulatory Visit: Payer: Self-pay | Admitting: *Deleted

## 2016-11-20 DIAGNOSIS — I6523 Occlusion and stenosis of bilateral carotid arteries: Secondary | ICD-10-CM

## 2017-06-23 DIAGNOSIS — E7800 Pure hypercholesterolemia, unspecified: Secondary | ICD-10-CM | POA: Diagnosis not present

## 2017-06-23 DIAGNOSIS — K219 Gastro-esophageal reflux disease without esophagitis: Secondary | ICD-10-CM | POA: Diagnosis not present

## 2017-06-23 DIAGNOSIS — I10 Essential (primary) hypertension: Secondary | ICD-10-CM | POA: Diagnosis not present

## 2017-06-23 DIAGNOSIS — E119 Type 2 diabetes mellitus without complications: Secondary | ICD-10-CM | POA: Diagnosis not present

## 2017-09-21 DIAGNOSIS — E785 Hyperlipidemia, unspecified: Secondary | ICD-10-CM | POA: Diagnosis not present

## 2017-09-21 DIAGNOSIS — E119 Type 2 diabetes mellitus without complications: Secondary | ICD-10-CM | POA: Diagnosis not present

## 2017-09-21 DIAGNOSIS — K219 Gastro-esophageal reflux disease without esophagitis: Secondary | ICD-10-CM | POA: Diagnosis not present

## 2017-09-21 DIAGNOSIS — I10 Essential (primary) hypertension: Secondary | ICD-10-CM | POA: Diagnosis not present

## 2017-11-25 ENCOUNTER — Other Ambulatory Visit: Payer: Self-pay

## 2017-11-25 NOTE — Patient Outreach (Signed)
[facility]) Care Management  11/25/2017  Penny Bryant 01/17/1951 [id number]   Medication Adherence call to Penny Bryant spoke with patient she ask if we can call doctor for more refills on Losartan/HCTZ 50/12.5 mg and Metformin ER 500 mg she has medication for 5 more days [facility] patient when ready. Penny Bryant is showing past due under [facility] Ins.   Lillia Abed CPhT Pharmacy Technician [facility] Care Management Direct Dial [phone]  Fax [phone] [name]

## 2017-12-07 ENCOUNTER — Other Ambulatory Visit: Payer: Self-pay

## 2017-12-07 NOTE — Patient Outreach (Signed)
[facility]) Care Management  12/07/2017  Penny Bryant 01-22-1951 [id number]   Medication Adherence call to Penny Bryant patient is due on Losartan / HCTZ 50/12.5 mg and Metformin Er 500 mg patient already pick up both from the pharmacy for a three month supply. Penny Bryant is showing past due under [facility] Ins.   Lillia Abed CPhT Pharmacy Technician [facility] Care Management Direct Dial [phone]  Fax [phone] [email or web address]

## 2018-05-20 DIAGNOSIS — K047 Periapical abscess without sinus: Secondary | ICD-10-CM | POA: Diagnosis not present

## 2018-06-02 ENCOUNTER — Other Ambulatory Visit: Payer: Self-pay

## 2018-06-02 NOTE — Patient Outreach (Signed)
[facility]) Care Management  06/02/2018  Penny Bryant Oct 27, 1950 [id number]   Medication Adherence call to Penny Bryant [employer] spoke with patient she is due on Losartan 100 mg and Metformin Er 500 mg patient explain she has plenty on Metformin but will pick up Losartan 100 mg from the pharmacy today she is  taking 1 tablet once daily on Losartan on Metformin 2 tablets daily. [name] is showing past due under [facility] Ins.   Lillia Abed CPhT Pharmacy Technician [facility] Care Management Direct Dial [phone]  Fax [phone] [email or web address]

## 2018-08-03 ENCOUNTER — Other Ambulatory Visit: Payer: Self-pay

## 2018-08-03 NOTE — Patient Outreach (Signed)
[facility]) Care Management  08/03/2018  DEZZIE BADILLA [birth year] [id number]   Medication Adherence call to Mrs. Joniya Matthies [employer] spoke with patient she is past due on Metformin Er 500 mg patient explain she is taking 1 tablet 2 times a day and has medication at this time patient will order when due. Mrs. Pletz is showing past due under [place].   [facility] Management Direct Dial [phone]  Fax [phone] [email or web address]

## 2018-08-09 ENCOUNTER — Ambulatory Visit
Admission: EM | Admit: 2018-08-09 | Discharge: 2018-08-09 | Disposition: A | Discharge status: Home / Self Care | Payer: Medicare Other

## 2018-08-09 ENCOUNTER — Encounter: Payer: Self-pay | Admitting: Emergency Medicine

## 2018-08-09 ENCOUNTER — Other Ambulatory Visit: Payer: Self-pay

## 2018-08-09 DIAGNOSIS — F4323 Adjustment disorder with mixed anxiety and depressed mood: Secondary | ICD-10-CM

## 2018-08-09 DIAGNOSIS — Z760 Encounter for issue of repeat prescription: Secondary | ICD-10-CM | POA: Diagnosis not present

## 2018-08-09 MED ORDER — HYDROCHLOROTHIAZIDE 25 MG PO TABS: 25.0000 mg | ORAL_TABLET | Freq: Every day | ORAL | 2 refills | Status: AC

## 2018-08-09 MED ORDER — METOPROLOL TARTRATE 25 MG PO TABS: 25.0000 mg | ORAL_TABLET | Freq: Two times a day (BID) | ORAL | 2 refills | Status: AC

## 2018-08-09 MED ORDER — LOSARTAN POTASSIUM 100 MG PO TABS: 100.0000 mg | ORAL_TABLET | Freq: Every day | ORAL | 2 refills | Status: AC

## 2018-08-09 MED ORDER — FLUOXETINE HCL 20 MG PO CAPS: ORAL_CAPSULE | ORAL | 2 refills | Status: AC

## 2018-08-09 MED ORDER — METFORMIN HCL 500 MG PO TABS: 1000.0000 mg | ORAL_TABLET | Freq: Two times a day (BID) | ORAL | 2 refills | Status: AC

## 2018-08-09 MED ORDER — CETIRIZINE HCL 10 MG PO TABS: 10.0000 mg | ORAL_TABLET | Freq: Every day | ORAL | 2 refills | Status: AC

## 2018-08-09 NOTE — ED Triage Notes (Signed)
Pt presents to [facility] for refills on all of her home medications (list updated)

## 2018-08-09 NOTE — ED Provider Notes (Signed)
[name]    CSN: [id number] Arrival date & time: 08/09/18  1042      History   Chief Complaint Chief Complaint  Patient presents with  . Medication Refill    HPI Penny Bryant is a 68 y.o. female.   The history is provided by the patient. No language interpreter was used.  Medication Refill Medications/supplies requested:  Zyrtec prozac, HCTZ metformin, lipitor lorsartan  Medications taken before: yes - see home medications   Patient has complete original prescription information: yes   Pt unable to get an appointment next door.  Pt states she has called multiple times with no answer.   Past Medical History:  Diagnosis Date  . Allergy     Patient Active Problem List   Diagnosis Date Noted  . Neck pain 10/29/2014  . Anxiety and depression 08/28/2014  . Stroke ([place]) 08/08/2014  . Arterial ischemic stroke, vertebrobasilar, thalamic, acute ([place]) 08/08/2014  . Hyperlipidemia 08/08/2014  . Tobacco use disorder 08/08/2014  . Obesity 08/08/2014  . Essential hypertension 07/06/2014    Past Surgical History:  Procedure Laterality Date  . CHOLECYSTECTOMY Bilateral     OB History   No obstetric history on file.      Home Medications    Prior to Admission medications   Medication Sig Start Date End Date Taking? Authorizing Provider  cetirizine (ZYRTEC) 10 MG tablet Take 1 tablet (10 mg total) by mouth daily. 08/09/18   Fransico Meadow, PA-C  FLUoxetine (PROZAC) 20 MG capsule Take 3 tablets a day 08/09/18   Fransico Meadow, PA-C  hydrochlorothiazide (HYDRODIURIL) 25 MG tablet Take 1 tablet (25 mg total) by mouth daily. 08/09/18   Fransico Meadow, PA-C  losartan (COZAAR) 100 MG tablet Take 1 tablet (100 mg total) by mouth daily. 08/09/18   Fransico Meadow, PA-C  metFORMIN (GLUCOPHAGE) 500 MG tablet Take 2 tablets (1,000 mg total) by mouth 2 (two) times daily with a meal. 08/09/18   Fransico Meadow, PA-C  metoprolol tartrate (LOPRESSOR) 25 MG tablet Take 1  tablet (25 mg total) by mouth 2 (two) times daily. 08/09/18   Fransico Meadow, PA-C  pneumococcal 23 valent vaccine (PNU-IMMUNE) 25 MCG/0.5ML injection Inject 0.5 mLs into the muscle once. 07/11/15   Tereasa Coop, PA-C  atorvastatin (LIPITOR) 10 MG tablet TAKE 1 TABLET BY MOUTH EVERY DAY AT [facility] 04/11/16 08/09/18  Harrison Mons, PA  losartan-hydrochlorothiazide (HYZAAR) 50-12.5 MG tablet TAKE 1 TABLET BY MOUTH TWICE A DAY *NEED OFFICE VISIT* 08/30/16 08/09/18  Tereasa Coop, PA-C    Family History Family History  Problem Relation Age of Onset  . Diabetes Mother   . Hypertension Mother   . Stroke Mother   . Diabetes Father   . Hypertension Father   . Hypertension Sister     Social History Social History   Tobacco Use  . Smoking status: Former Smoker    Packs/day: 1.50  . Smokeless tobacco: Never Used  Substance Use Topics  . Alcohol use: Yes    Alcohol/week: 3.0 standard drinks    Types: 3 Glasses of wine per week  . Drug use: No     Allergies   Sulfur   Review of Systems Review of Systems  All other systems reviewed and are negative.    Physical Exam Triage Vital Signs ED Triage Vitals  Enc Vitals Group     BP 08/09/18 1100 (!) 143/82     Pulse Rate 08/09/18 1100 70  Resp 08/09/18 1100 18     Temp 08/09/18 1100 99 F (37.2 C)     Temp Source 08/09/18 1100 Oral     SpO2 08/09/18 1100 97 %     Weight --      Height --      Head Circumference --      Peak Flow --      Pain Score 08/09/18 1101 0     Pain Loc --      Pain Edu? --      Excl. in GC? --    No data found.  Updated Vital Signs BP (!) 143/82 (BP Location: Left Arm)   Pulse 70   Temp 99 F (37.2 C) (Oral)   Resp 18   SpO2 97%   Visual Acuity Right Eye Distance:   Left Eye Distance:   Bilateral Distance:    Right Eye Near:   Left Eye Near:    Bilateral Near:     Physical Exam Vitals signs reviewed.  Constitutional:      Appearance: Normal appearance.  HENT:     Head:  Normocephalic.  Neck:     Musculoskeletal: Normal range of motion.  Cardiovascular:     Rate and Rhythm: Normal rate.     Pulses: Normal pulses.  Pulmonary:     Effort: Pulmonary effort is normal.  Abdominal:     General: Abdomen is flat.  Musculoskeletal: Normal range of motion.  Skin:    General: Skin is warm.  Neurological:     General: No focal deficit present.     Mental Status: She is alert.  Psychiatric:        Mood and Affect: Mood normal.      UC Treatments / Results  Labs (all labs ordered are listed, but only abnormal results are displayed) Labs Reviewed - No data to display  EKG   Radiology No results found.  Procedures Procedures (including critical care time)  Medications Ordered in UC Medications - No data to display  Initial Impression / Assessment and Plan / UC Course  I have reviewed the triage vital signs and the nursing notes.  Pertinent labs & imaging results that were available during my care of the patient were reviewed by me and considered in my medical decision making (see chart for details).      Final Clinical Impressions(s) / UC Diagnoses   Final diagnoses:  Medication refill   Discharge Instructions   None    ED Prescriptions    Medication Sig Dispense Auth. Provider   metoprolol tartrate (LOPRESSOR) 25 MG tablet Take 1 tablet (25 mg total) by mouth 2 (two) times daily. 60 tablet Breshay Ilg K, [place]   cetirizine (ZYRTEC) 10 MG tablet Take 1 tablet (10 mg total) by mouth daily. 30 tablet Koen Antilla K, [place]   FLUoxetine (PROZAC) 20 MG capsule Take 3 tablets a day 90 capsule Stephnie Parlier K, [place]-C   hydrochlorothiazide (HYDRODIURIL) 25 MG tablet Take 1 tablet (25 mg total) by mouth daily. 30 tablet Geraldene Eisel K, PA-C   losartan (COZAAR) 100 MG tablet Take 1 tablet (100 mg total) by mouth daily. 30 tablet Kashden Deboy K, [place]   metFORMIN (GLUCOPHAGE) 500 MG tablet Take 2 tablets (1,000 mg total) by mouth 2 (two) times  daily with a meal. 120 tablet Elson AreasSofia, Visente Kirker K, [place]-C     Controlled Substance Prescriptions [place] Controlled Substance Registry consulted? Not Applicable   Elson AreasSofia, Makahla Kiser K, [place]-C 08/09/18 1234

## 2018-08-09 NOTE — ED Notes (Signed)
Patient able to ambulate independently  

## 2018-08-19 ENCOUNTER — Encounter: Payer: Self-pay | Admitting: Family Medicine

## 2018-08-20 ENCOUNTER — Telehealth: Payer: Self-pay

## 2018-08-20 NOTE — Telephone Encounter (Signed)
Called patient to do their pre-visit COVID screening.  Call went to voicemail. Unable to do prescreening.  

## 2018-08-23 ENCOUNTER — Inpatient Hospital Stay: Payer: Medicare Other | Admitting: Family Medicine

## 2018-12-27 DIAGNOSIS — I639 Cerebral infarction, unspecified: Secondary | ICD-10-CM | POA: Diagnosis not present

## 2018-12-27 DIAGNOSIS — I10 Essential (primary) hypertension: Secondary | ICD-10-CM | POA: Diagnosis not present

## 2018-12-27 DIAGNOSIS — E119 Type 2 diabetes mellitus without complications: Secondary | ICD-10-CM | POA: Diagnosis not present

## 2019-03-01 DIAGNOSIS — E119 Type 2 diabetes mellitus without complications: Secondary | ICD-10-CM | POA: Diagnosis not present

## 2019-09-06 DIAGNOSIS — E119 Type 2 diabetes mellitus without complications: Secondary | ICD-10-CM | POA: Diagnosis not present

## 2019-09-06 DIAGNOSIS — R799 Abnormal finding of blood chemistry, unspecified: Secondary | ICD-10-CM | POA: Diagnosis not present

## 2019-09-06 DIAGNOSIS — E1140 Type 2 diabetes mellitus with diabetic neuropathy, unspecified: Secondary | ICD-10-CM | POA: Diagnosis not present

## 2019-09-06 DIAGNOSIS — E538 Deficiency of other specified B group vitamins: Secondary | ICD-10-CM | POA: Diagnosis not present

## 2019-09-06 DIAGNOSIS — N189 Chronic kidney disease, unspecified: Secondary | ICD-10-CM | POA: Diagnosis not present

## 2019-09-06 DIAGNOSIS — D649 Anemia, unspecified: Secondary | ICD-10-CM | POA: Diagnosis not present

## 2019-09-06 DIAGNOSIS — I10 Essential (primary) hypertension: Secondary | ICD-10-CM | POA: Diagnosis not present

## 2019-09-06 DIAGNOSIS — Z0000 Encounter for general adult medical examination without abnormal findings: Secondary | ICD-10-CM | POA: Diagnosis not present

## 2019-09-06 DIAGNOSIS — Z049 Encounter for examination and observation for unspecified reason: Secondary | ICD-10-CM | POA: Diagnosis not present

## 2019-09-06 DIAGNOSIS — I4891 Unspecified atrial fibrillation: Secondary | ICD-10-CM | POA: Diagnosis not present

## 2019-09-20 DIAGNOSIS — E119 Type 2 diabetes mellitus without complications: Secondary | ICD-10-CM | POA: Diagnosis not present

## 2019-09-20 DIAGNOSIS — H40033 Anatomical narrow angle, bilateral: Secondary | ICD-10-CM | POA: Diagnosis not present

## 2022-05-18 DIAGNOSIS — R55 Syncope and collapse: Secondary | ICD-10-CM | POA: Diagnosis not present

## 2022-05-18 DIAGNOSIS — I959 Hypotension, unspecified: Secondary | ICD-10-CM | POA: Diagnosis not present

## 2022-05-18 DIAGNOSIS — R42 Dizziness and giddiness: Secondary | ICD-10-CM | POA: Diagnosis not present

## 2022-05-18 DIAGNOSIS — W19XXXA Unspecified fall, initial encounter: Secondary | ICD-10-CM | POA: Diagnosis not present

## 2023-07-10 ENCOUNTER — Other Ambulatory Visit: Payer: Self-pay | Admitting: Family Medicine

## 2023-07-10 DIAGNOSIS — F17210 Nicotine dependence, cigarettes, uncomplicated: Secondary | ICD-10-CM
